# Patient Record
Sex: Male | Born: 1980 | Race: Black or African American | Hispanic: No | Marital: Single | Smoking: Current every day smoker
Health system: Southern US, Community
[De-identification: ages and names within clinical notes are randomized; demographics above are authoritative.]

## PROBLEM LIST (undated history)

## (undated) DIAGNOSIS — I4891 Unspecified atrial fibrillation: Secondary | ICD-10-CM

## (undated) DIAGNOSIS — R55 Syncope and collapse: Secondary | ICD-10-CM

## (undated) DIAGNOSIS — I8390 Asymptomatic varicose veins of unspecified lower extremity: Secondary | ICD-10-CM

## (undated) DIAGNOSIS — R002 Palpitations: Secondary | ICD-10-CM

## (undated) DIAGNOSIS — I10 Essential (primary) hypertension: Secondary | ICD-10-CM

## (undated) HISTORY — DX: Palpitations: R00.2

## (undated) HISTORY — DX: Syncope and collapse: R55

## (undated) HISTORY — PX: APPENDECTOMY: SHX54

---

## 2002-03-25 ENCOUNTER — Emergency Department (HOSPITAL_COMMUNITY): Admission: EM | Admit: 2002-03-25 | Discharge: 2002-03-25 | Payer: Self-pay | Admitting: Emergency Medicine

## 2002-03-25 ENCOUNTER — Encounter: Payer: Self-pay | Admitting: Emergency Medicine

## 2003-09-06 ENCOUNTER — Emergency Department (HOSPITAL_COMMUNITY): Admission: EM | Admit: 2003-09-06 | Discharge: 2003-09-06 | Payer: Self-pay | Admitting: Emergency Medicine

## 2004-04-08 ENCOUNTER — Emergency Department (HOSPITAL_COMMUNITY): Admission: EM | Admit: 2004-04-08 | Discharge: 2004-04-09 | Payer: Self-pay | Admitting: Emergency Medicine

## 2012-05-04 ENCOUNTER — Emergency Department (HOSPITAL_COMMUNITY)
Admission: EM | Admit: 2012-05-04 | Discharge: 2012-05-04 | Disposition: A | Discharge status: Home / Self Care | Payer: Self-pay | Attending: Emergency Medicine | Admitting: Emergency Medicine

## 2012-05-04 ENCOUNTER — Encounter (HOSPITAL_COMMUNITY): Payer: Self-pay | Admitting: *Deleted

## 2012-05-04 ENCOUNTER — Emergency Department (HOSPITAL_COMMUNITY): Payer: Self-pay

## 2012-05-04 DIAGNOSIS — R55 Syncope and collapse: Secondary | ICD-10-CM | POA: Insufficient documentation

## 2012-05-04 DIAGNOSIS — F17200 Nicotine dependence, unspecified, uncomplicated: Secondary | ICD-10-CM | POA: Insufficient documentation

## 2012-05-04 DIAGNOSIS — I10 Essential (primary) hypertension: Secondary | ICD-10-CM | POA: Insufficient documentation

## 2012-05-04 HISTORY — DX: Syncope and collapse: R55

## 2012-05-04 HISTORY — DX: Essential (primary) hypertension: I10

## 2012-05-04 LAB — CBC
HCT: 40.3 % (ref 39.0–52.0)
Hemoglobin: 13.7 g/dL (ref 13.0–17.0)
MCHC: 34 g/dL (ref 30.0–36.0)
MCV: 93.3 fL (ref 78.0–100.0)
Platelets: 184 10*3/uL (ref 150–400)
RBC: 4.32 MIL/uL (ref 4.22–5.81)
RDW: 13 % (ref 11.5–15.5)
WBC: 8.8 10*3/uL (ref 4.0–10.5)

## 2012-05-04 LAB — COMPREHENSIVE METABOLIC PANEL
ALT: 26 U/L (ref 0–53)
AST: 34 U/L (ref 0–37)
Albumin: 4.1 g/dL (ref 3.5–5.2)
Alkaline Phosphatase: 50 U/L (ref 39–117)
BUN: 9 mg/dL (ref 6–23)
Chloride: 102 mEq/L (ref 96–112)
Creatinine, Ser: 1.05 mg/dL (ref 0.50–1.35)
GFR calc Af Amer: >90 mL/min (ref >90)
GFR calc non Af Amer: >90 mL/min (ref >90)
Glucose, Bld: 94 mg/dL (ref 70–99)
Potassium: 3.5 mEq/L (ref 3.5–5.1)
Sodium: 138 mEq/L (ref 135–145)
Total Bilirubin: 0.4 mg/dL (ref 0.3–1.2)
Total Protein: 7.9 g/dL (ref 6.0–8.3)

## 2012-05-04 LAB — PROTIME-INR
INR: 0.97 (ref 0.00–1.49)
Prothrombin Time: 13.1 seconds (ref 11.6–15.2)

## 2012-05-04 MED ORDER — SODIUM CHLORIDE 0.9 % IV BOLUS (SEPSIS)
500.0000 mL | Freq: Once | INTRAVENOUS | Status: AC
  Administered 2012-05-04: 500 mL via INTRAVENOUS

## 2012-05-04 MED ORDER — ONDANSETRON HCL 4 MG/2ML IJ SOLN
4.0000 mg | Freq: Once | INTRAMUSCULAR | Status: AC
  Administered 2012-05-04: 4 mg via INTRAVENOUS

## 2012-05-04 MED ORDER — ONDANSETRON HCL 4 MG/2ML IJ SOLN
INTRAMUSCULAR | Status: AC
  Filled 2012-05-04: qty 2

## 2012-05-04 NOTE — ED Notes (Signed)
Pt ambulated without assistance.  EDP notified

## 2012-05-04 NOTE — ED Notes (Signed)
While visiting another pt, RN noticed pt was pale and diaphoretic, pt c/o SOB and dizziness. HR low 40s, placed on stretcher and put in [place].

## 2012-05-04 NOTE — ED Provider Notes (Addendum)
History     CSN: [id number]  Arrival date & time 05/04/12  0215   First MD Initiated Contact with Patient 05/04/12 0215      Chief Complaint  Patient presents with  . Loss of Consciousness    (Consider location/radiation/quality/duration/timing/severity/associated sxs/prior treatment) Patient is a 31 y.o. male presenting with syncope. The history is provided by the patient.  Loss of Consciousness This is a new problem. The current episode started less than 1 hour ago. The problem occurs constantly. The problem has been gradually improving. Associated symptoms include shortness of breath. Pertinent negatives include no chest pain and no abdominal pain. Nothing aggravates the symptoms. Nothing relieves the symptoms.    Past Medical History  Diagnosis Date  . Hypertension     History reviewed. No pertinent past surgical history.  History reviewed. No pertinent family history.  History  Substance Use Topics  . Smoking status: Current Everyday Smoker -- 1.0 packs/day  . Smokeless tobacco: Not on file  . Alcohol Use: Yes      Review of Systems  Respiratory: Positive for shortness of breath.   Cardiovascular: Positive for syncope. Negative for chest pain.  Gastrointestinal: Negative for abdominal pain.  All other systems reviewed and are negative.    Allergies  Penicillins  Home Medications  No current outpatient prescriptions on file.  BP 114/60  Pulse 66  Resp 18  SpO2 91%  Physical Exam  Constitutional: He is oriented to person, place, and time. He appears well-developed and well-nourished.  HENT:  Head: Normocephalic and atraumatic.  Eyes: Conjunctivae are normal. Pupils are equal, round, and reactive to light.  Neck: Normal range of motion. Neck supple.  Cardiovascular: Normal rate, regular rhythm, normal heart sounds and intact distal pulses.   Pulmonary/Chest: Effort normal and breath sounds normal.  Abdominal: Soft. Bowel sounds are normal.    Neurological: He is alert and oriented to person, place, and time.  Skin: Skin is warm. He is diaphoretic.  Psychiatric: He has a normal mood and affect. His behavior is normal. Judgment and thought content normal.    ED Course  Procedures (including critical care time)   Labs Reviewed  GLUCOSE, CAPILLARY  CBC  COMPREHENSIVE METABOLIC PANEL  PROTIME-INR  DRUG SCREEN PANEL (SERUM)   No results found.   No diagnosis found.   Date: 05/04/2012  Rate: 65  Rhythm: normal sinus rhythm  QRS Axis: normal  Intervals: normal  ST/T Wave abnormalities: nonspecific ST changes  Conduction Disutrbances:nonspecific intraventricular conduction delay  Narrative Interpretation:   Old EKG Reviewed: none available    MDM  + syncopal episode while visiting pt.  Pt had seen person visiting pass large amount of blood vaginally, and then became lightheaded and dizzy.Marland Kitchen  Sxs gradually improving,  Will lab, ct,  reasess   Improved.  At baseline.  Labs/ct benign.  Will dc to [facility] outpt     Tasnim Balentine Lytle Michaels, MD 05/04/12 [id number]  Rosanne Ashing, MD 05/04/12 [phone]

## 2012-05-04 NOTE — ED Notes (Signed)
No rx given, pt voiced understanding to f/u with PCP and return for worsening condition

## 2012-05-14 LAB — DRUG SCREEN PANEL (SERUM)

## 2012-05-28 ENCOUNTER — Emergency Department (HOSPITAL_COMMUNITY)
Admission: EM | Admit: 2012-05-28 | Discharge: 2012-05-28 | Disposition: A | Discharge status: Home Health | Payer: Self-pay | Attending: Emergency Medicine | Admitting: Emergency Medicine

## 2012-05-28 ENCOUNTER — Encounter (HOSPITAL_COMMUNITY): Payer: Self-pay | Admitting: Family Medicine

## 2012-05-28 DIAGNOSIS — F17200 Nicotine dependence, unspecified, uncomplicated: Secondary | ICD-10-CM | POA: Insufficient documentation

## 2012-05-28 DIAGNOSIS — R002 Palpitations: Secondary | ICD-10-CM | POA: Insufficient documentation

## 2012-05-28 DIAGNOSIS — I4891 Unspecified atrial fibrillation: Secondary | ICD-10-CM | POA: Insufficient documentation

## 2012-05-28 DIAGNOSIS — I10 Essential (primary) hypertension: Secondary | ICD-10-CM | POA: Insufficient documentation

## 2012-05-28 HISTORY — DX: Unspecified atrial fibrillation: I48.91

## 2012-05-28 LAB — POCT I-STAT TROPONIN I: Troponin i, poc: 0 ng/mL (ref 0.00–0.08)

## 2012-05-28 LAB — CBC WITH DIFFERENTIAL/PLATELET
Basophils Absolute: 0 10*3/uL (ref 0.0–0.1)
Basophils Relative: 0 % (ref 0–1)
Eosinophils Absolute: 0.1 10*3/uL (ref 0.0–0.7)
Eosinophils Relative: 2 % (ref 0–5)
HCT: 45.4 % (ref 39.0–52.0)
Hemoglobin: 15 g/dL (ref 13.0–17.0)
Lymphocytes Relative: 47 % — ABNORMAL HIGH (ref 12–46)
Lymphs Abs: 2.5 10*3/uL (ref 0.7–4.0)
MCH: 31.2 pg (ref 26.0–34.0)
MCHC: 33 g/dL (ref 30.0–36.0)
MCV: 94.4 fL (ref 78.0–100.0)
Monocytes Absolute: 0.5 10*3/uL (ref 0.1–1.0)
Monocytes Relative: 8 % (ref 3–12)
Neutro Abs: 2.3 10*3/uL (ref 1.7–7.7)
Neutrophils Relative %: 43 % (ref 43–77)
Platelets: 227 10*3/uL (ref 150–400)
RBC: 4.81 MIL/uL (ref 4.22–5.81)
RDW: 13.1 % (ref 11.5–15.5)
WBC: 5.4 10*3/uL (ref 4.0–10.5)

## 2012-05-28 LAB — RAPID URINE DRUG SCREEN, HOSP PERFORMED
Amphetamines: NOT DETECTED
Barbiturates: NOT DETECTED
Benzodiazepines: NOT DETECTED
Cocaine: NOT DETECTED
Opiates: NOT DETECTED
Tetrahydrocannabinol: POSITIVE — AB

## 2012-05-28 LAB — POCT I-STAT, CHEM 8
BUN: 14 mg/dL (ref 6–23)
Creatinine, Ser: 1.2 mg/dL (ref 0.50–1.35)
Hemoglobin: 17 g/dL (ref 13.0–17.0)
Potassium: 4.1 mEq/L (ref 3.5–5.1)
Sodium: 139 mEq/L (ref 135–145)

## 2012-05-28 NOTE — Progress Notes (Signed)
10:20 AM  Date: 05/28/2012  Rate: 90  Rhythm: normal sinus rhythm and sinus arrhythmia  QRS Axis: normal  Intervals: normal QRS:  Q waves in inferior leads suggest possible old inferior myocardial infarction.  ST/T Wave abnormalities: normal  Conduction Disutrbances:none  Narrative Interpretation: Abnormal EKG  Old EKG Reviewed: unchanged

## 2012-05-28 NOTE — ED Notes (Signed)
Patient states last night his felt like it was fluttering and he could not take deep breaths.  Patient states he has history of a-fib.  Patient asymptomatic at this time, patient with no c/o and denies chest pain

## 2012-05-28 NOTE — ED Notes (Signed)
Pt complaining of his heart fluttering since last night. sts hx of A fib. sts associated with nausea, SOB, dizzy and lightheaded. Denies chest pain.

## 2012-05-28 NOTE — ED Provider Notes (Deleted)
10:07 AM  Date: 05/28/2012  Rate:90  Rhythm: normal sinus rhythm and sinus arrhythmia  QRS Axis: normal  Intervals: normal QRS:  Q waves in inferior leads suggest possible old inferior myocardial infarction.  ST/T Wave abnormalities: normal  Conduction Disutrbances:none  Narrative Interpretation: Abnormal EKG  Old EKG Reviewed: none available    Carleene Cooper III, MD 05/28/12 1009

## 2012-05-28 NOTE — ED Provider Notes (Signed)
History     CSN: [id number]  Arrival date & time 05/28/12  4098   First MD Initiated Contact with Patient 05/28/12 1014      Chief Complaint  Patient presents with  . Atrial Fibrillation    (Consider location/radiation/quality/duration/timing/severity/associated sxs/prior treatment) Patient is a 31 y.o. male presenting with palpitations. The history is provided by the patient.  Palpitations  This is a new problem. The current episode started yesterday. The problem has been resolved. The problem is associated with an unknown factor. Associated symptoms include diaphoresis, nausea, headaches, dizziness, weakness and shortness of breath. Pertinent negatives include no fever, no numbness, no abdominal pain, no vomiting and no cough.  Pt states last night and this morning intermittent palpitations with dizziness, nausea, SOB. States has hx of afib, not taking his medications for it. States no chest pain or abdominal pain. Admits to drinking alcohol sat, otherwise no drugs. States at time of symptoms has some tingling down left arm. No hx of CAD. At present, he is asymptomatic.   Past Medical History  Diagnosis Date  . Hypertension   . Atrial fibrillation     History reviewed. No pertinent past surgical history.  History reviewed. No pertinent family history.  History  Substance Use Topics  . Smoking status: Current Everyday Smoker -- 1.0 packs/day  . Smokeless tobacco: Not on file  . Alcohol Use: Yes      Review of Systems  Constitutional: Positive for diaphoresis. Negative for fever and chills.  HENT: Negative for neck pain.   Eyes: Negative for visual disturbance.  Respiratory: Positive for shortness of breath. Negative for cough, chest tightness and wheezing.   Cardiovascular: Positive for palpitations.  Gastrointestinal: Positive for nausea. Negative for vomiting, abdominal pain and diarrhea.  Musculoskeletal: Negative.   Skin: Negative.   Neurological: Positive for  dizziness, weakness, light-headedness and headaches. Negative for numbness.  Hematological: Negative.     Allergies  Penicillins  Home Medications  No current outpatient prescriptions on file.  BP 139/92  Temp 97.6 F (36.4 C) (Oral)  Resp 18  SpO2 100%  Physical Exam  Nursing note and vitals reviewed. Constitutional: He is oriented to person, place, and time. He appears well-developed and well-nourished. No distress.  Neck: Neck supple.  Cardiovascular: Normal rate, regular rhythm and normal heart sounds.   Pulmonary/Chest: Effort normal and breath sounds normal. No respiratory distress. He has no wheezes. He has no rales.  Abdominal: Soft. Bowel sounds are normal. He exhibits no distension. There is no tenderness. There is no rebound.  Musculoskeletal: He exhibits no edema.  Neurological: He is alert and oriented to person, place, and time.  Skin: Skin is warm and dry.  Psychiatric: He has a normal mood and affect.    ED Course  Procedures (including critical care time)  Pt currently asymptomatic, ECG showing NSR.  Filed Vitals:   05/28/12 1038  BP: 139/92  Temp: 97.6 F (36.4 C)  Resp: 18   Will get labs.   Results for orders placed during the hospital encounter of 05/28/12  CBC WITH DIFFERENTIAL      Component Value Range   WBC 5.4  4.0 - 10.5 K/uL   RBC 4.81  4.22 - 5.81 MIL/uL   Hemoglobin 15.0  13.0 - 17.0 g/dL   HCT 11.9  14.7 - 82.9 %   MCV 94.4  78.0 - 100.0 fL   MCH 31.2  26.0 - 34.0 pg   MCHC 33.0  30.0 - 36.0 g/dL  RDW 13.1  11.5 - 15.5 %   Platelets 227  150 - 400 K/uL   Neutrophils Relative 43  43 - 77 %   Neutro Abs 2.3  1.7 - 7.7 K/uL   Lymphocytes Relative 47 (*) 12 - 46 %   Lymphs Abs 2.5  0.7 - 4.0 K/uL   Monocytes Relative 8  3 - 12 %   Monocytes Absolute 0.5  0.1 - 1.0 K/uL   Eosinophils Relative 2  0 - 5 %   Eosinophils Absolute 0.1  0.0 - 0.7 K/uL   Basophils Relative 0  0 - 1 %   Basophils Absolute 0.0  0.0 - 0.1 K/uL  URINE  RAPID DRUG SCREEN (HOSP PERFORMED)      Component Value Range   Opiates NONE DETECTED  NONE DETECTED   Cocaine NONE DETECTED  NONE DETECTED   Benzodiazepines NONE DETECTED  NONE DETECTED   Amphetamines NONE DETECTED  NONE DETECTED   Tetrahydrocannabinol POSITIVE (*) NONE DETECTED   Barbiturates NONE DETECTED  NONE DETECTED  POCT I-STAT, CHEM 8      Component Value Range   Sodium 139  135 - 145 mEq/L   Potassium 4.1  3.5 - 5.1 mEq/L   Chloride 101  96 - 112 mEq/L   BUN 14  6 - 23 mg/dL   Creatinine, Ser 7.82  0.50 - 1.35 mg/dL   Glucose, Bld 87  70 - 99 mg/dL   Calcium, Ion 9.56 (*) 1.12 - 1.23 mmol/L   TCO2 27  0 - 100 mmol/L   Hemoglobin 17.0  13.0 - 17.0 g/dL   HCT 21.3  08.6 - 57.8 %  POCT I-STAT TROPONIN I      Component Value Range   Troponin i, poc 0.00  0.00 - 0.08 ng/mL   Comment 3             Workup unremarkable. Normal sinus on monitor here, and pt is symptom free. I spoke with [facility] cardiology, will call him tomorrow for outpatient follow up for possible holter and further testing. Pt agrees with the plan. Ceasar Mons Vitals:   05/28/12 1243  BP: 115/98  Pulse: 80  Temp:   Resp: 16     1. Palpitations       MDM          Lottie Mussel, PA 05/28/12 1611

## 2012-05-29 NOTE — ED Provider Notes (Signed)
Medical screening examination/treatment/procedure(s) were performed by non-physician practitioner and as supervising physician I was immediately available for consultation/collaboration.   Carleene Cooper III, MD 05/29/12 1054

## 2012-06-22 ENCOUNTER — Encounter: Payer: Self-pay | Admitting: Cardiology

## 2012-06-25 ENCOUNTER — Ambulatory Visit: Payer: Self-pay | Admitting: Cardiology

## 2012-08-03 ENCOUNTER — Encounter: Payer: Self-pay | Admitting: *Deleted

## 2012-08-08 ENCOUNTER — Ambulatory Visit: Payer: Self-pay | Admitting: Cardiology

## 2012-08-30 ENCOUNTER — Encounter: Payer: Self-pay | Admitting: *Deleted

## 2012-09-03 ENCOUNTER — Encounter: Payer: Self-pay | Admitting: Cardiology

## 2012-09-03 ENCOUNTER — Ambulatory Visit (INDEPENDENT_AMBULATORY_CARE_PROVIDER_SITE_OTHER): Payer: Self-pay | Admitting: Cardiology

## 2012-09-03 VITALS — BP 124/86 | HR 67 | Ht 70.0 in | Wt 263.0 lb

## 2012-09-03 DIAGNOSIS — I4891 Unspecified atrial fibrillation: Secondary | ICD-10-CM

## 2012-09-03 DIAGNOSIS — R0989 Other specified symptoms and signs involving the circulatory and respiratory systems: Secondary | ICD-10-CM

## 2012-09-03 DIAGNOSIS — R55 Syncope and collapse: Secondary | ICD-10-CM

## 2012-09-03 DIAGNOSIS — R0609 Other forms of dyspnea: Secondary | ICD-10-CM

## 2012-09-03 DIAGNOSIS — G4733 Obstructive sleep apnea (adult) (pediatric): Secondary | ICD-10-CM

## 2012-09-03 DIAGNOSIS — R0683 Snoring: Secondary | ICD-10-CM

## 2012-09-03 LAB — TSH: TSH: 1.08 u[IU]/mL (ref 0.35–5.50)

## 2012-09-03 MED ORDER — DILTIAZEM HCL ER COATED BEADS 120 MG PO CP24: 120.0000 mg | ORAL_CAPSULE | Freq: Every day | ORAL | Status: DC

## 2012-09-03 NOTE — Progress Notes (Signed)
Patient ID: Vincent Mcpherson, male   DOB: [birth year], 31 y.o.   MRN: [id number] 31 yo with history of atrial fibrillation presents for cardiology evaluation.  He was admitted in 2012 at [facility] with palpitations.  He was told that he was in atrial fibrillation.  They kept him a couple of days.  He was told he had sleep apnea.  He was discharged on a medication but does not remember what it was and is no longer taking it.  He passed out in 8/13.  He was seen in the ER, and according to the ER report, it was thought to be vasovagal (stressful event when the syncope occurred).  However, patient reports racing heart rate just before passing out.  He was admitted again in 9/13 after drinking a lot of ETOH and developing tachypalpitations.  Per ER report, he was in atrial fibrillation with RVR.  However, the only ECG available from the ER showed NSR.    Since that event, he has been stable.  He has palpitations off and on but nothing severe.  Short episodes occur where he feels his heart race.  He does not take any medications.  At some point, he was diagnosed with HTN but BP is ok today and he is not on medications.  No exertional dyspnea or chest pain.  No further syncope or lightheadedness.  He does snore loudly and has daytime sleepiness.    ECG: NSR, LVH  Labs (9/13): K 4.1, creatinine 1.2  PMH: 1. Atrial fibrillation: Summer 2012 admitted at [facility] for a couple of days with atrial fibrillation.  Does not remember a lot of the details of this hospitalization.  To ER at [facility] in 9/13 with palpitations, reportedly in atrial fibrillation on arrival to ER but only ECG in system shows NSR.   2. ?HTN: At some point was given a diagnosis of HTN but says he has never been on medications for it.  3. OSA: Not on CPAP. Was told he had OSA when he was admitted at [facility] in 2012.  4. H/o syncope: 8/13.  Was seen in ER.  It sounds like it was thought to be vasovagal based on ER note but patients reported  tachypalpitations prior to passing out.   SH: Smokes 1 ppd, smokes marijuana, no cocaine.  Drinks ETOH, occasionally heavy.  Unemployed.   FH: Mother with atrial fibrillation.   ROS: All systems reviewed and negative except as per HPI.   Current Outpatient Prescriptions  Medication Sig Dispense Refill  . naproxen sodium (ALEVE) 220 MG tablet PRN      . aspirin EC 81 MG tablet Take 1 tablet (81 mg total) by mouth daily.      Marland Kitchen diltiazem (CARDIZEM CD) 120 MG 24 hr capsule Take 1 capsule (120 mg total) by mouth daily.  30 capsule  3    BP 124/86  Pulse 67  Ht 5\' 10"  (1.778 m)  Wt 263 lb (119.296 kg)  BMI 37.74 kg/m2  SpO2 97% General: NAD, obese.  Neck: No JVD, no thyromegaly or thyroid nodule.  Lungs: Clear to auscultation bilaterally with normal respiratory effort. CV: Nondisplaced PMI.  Heart regular S1/S2, no S3/S4, no murmur.  No peripheral edema.  No carotid bruit.  Normal pedal pulses.  Abdomen: Soft, nontender, no hepatosplenomegaly, no distention.  Skin: Intact without lesions or rashes.  Neurologic: Alert and oriented x 3.  Psych: Normal affect. Extremities: No clubbing or cyanosis.  HEENT: Normal.   Assessment/Plan: 1. Atrial fibrillation: Apparently  has paroxysmal atrial fibrillation.  He had several days of hospitalization at [facility] apparently for atrial fibrillation in 2012.  He presented to [facility] ER in 9/13 with atrial fibrillation/RVR after drinking ETOH.  However, the only ECG from this visit shows NSR.  He continues to have episodes of tachypalpitations on occasion.   - Start diltiazem CD 120 mg daily.  - CHADSVASC score = 0 or 1 (? HTN) so will take ASA 81.   - 30 day event monitor to document atrial fibrillation and its frequency.   - Avoid heavy ETOH intake.   - Echocardiogram.  - Check TSH 2. OSA: He was told he had sleep apnea at [facility] in 2010.  He has daytime sleepiness and loud snoring. He has not been formally diagnosed by sleep study.  Will  arrange sleep study.  3. Syncope: Syncopal episode in 8/13.  Felt heart racing prior to passing out.  Cannot rule out arrhythmic event (however, was exposed to stressful event at the time and felt to be vasovagal).  As above, getting 30 day event monitor.   Marca Ancona 09/03/2012 4:58 PM

## 2012-09-03 NOTE — Patient Instructions (Addendum)
Take aspirin 81mg  daily. Take this with food.   Take Diltiazem CD 120mg  daily.  Your physician recommends that you have lab work today--TSH  Your physician has requested that you have an echocardiogram. Echocardiography is a painless test that uses sound waves to create images of your heart. It provides your doctor with information about the size and shape of your heart and how well your heart's chambers and valves are working. This procedure takes approximately one hour. There are no restrictions for this procedure.  Your physician has recommended that you have a sleep study. This test records several body functions during sleep, including: brain activity, eye movement, oxygen and carbon dioxide blood levels, heart rate and rhythm, breathing rate and rhythm, the flow of air through your mouth and nose, snoring, body muscle movements, and chest and belly movement.   Your physician has recommended that you wear an event monitor. Event monitors are medical devices that record the heart's electrical activity. Doctors most often [place] these monitors to diagnose arrhythmias. Arrhythmias are problems with the speed or rhythm of the heartbeat. The monitor is a small, portable device. You can wear one while you do your normal daily activities. This is usually used to diagnose what is causing palpitations/syncope (passing out). 30 day event monitor  Your physician recommends that you schedule a follow-up appointment in: about 6 weeks with Dr Shirlee Latch after all testing has been completed.

## 2012-09-18 ENCOUNTER — Telehealth: Payer: Self-pay | Admitting: *Deleted

## 2012-09-18 NOTE — Telephone Encounter (Signed)
MONITOR      [name]     Sent: Mon September 10, 2012  8:29 AM         Vincent Mcpherson    MRN: [id number] DOB: [birth year]    Pt Home: [phone]               Message     09/10/12   09/03/12 PATIENT WILL FILL OUT LIFE WATCH FORM AND RETURN IT/D.MILLER----09/05/12 PATIENT WILL SEE ME ON FRIDAY/D.MILLER----09/10/12  PATIENT NO SHOW./D.MILLER

## 2012-09-21 ENCOUNTER — Ambulatory Visit (HOSPITAL_COMMUNITY): Payer: Self-pay | Attending: Cardiology | Admitting: Radiology

## 2012-09-21 DIAGNOSIS — I4891 Unspecified atrial fibrillation: Secondary | ICD-10-CM | POA: Insufficient documentation

## 2012-09-21 DIAGNOSIS — I10 Essential (primary) hypertension: Secondary | ICD-10-CM | POA: Insufficient documentation

## 2012-09-21 DIAGNOSIS — I059 Rheumatic mitral valve disease, unspecified: Secondary | ICD-10-CM | POA: Insufficient documentation

## 2012-09-21 DIAGNOSIS — F17200 Nicotine dependence, unspecified, uncomplicated: Secondary | ICD-10-CM | POA: Insufficient documentation

## 2012-09-21 DIAGNOSIS — I369 Nonrheumatic tricuspid valve disorder, unspecified: Secondary | ICD-10-CM | POA: Insufficient documentation

## 2012-09-21 NOTE — Progress Notes (Signed)
Echocardiogram performed.  

## 2012-10-04 ENCOUNTER — Ambulatory Visit (HOSPITAL_BASED_OUTPATIENT_CLINIC_OR_DEPARTMENT_OTHER): Payer: Self-pay

## 2012-10-15 ENCOUNTER — Ambulatory Visit: Payer: Self-pay | Admitting: Cardiology

## 2012-11-07 ENCOUNTER — Telehealth: Payer: Self-pay | Admitting: *Deleted

## 2012-11-07 NOTE — Telephone Encounter (Signed)
Rec'd pc from [facility] [place]. They are having difficulty getting in touch with patient. Requesting our assistance to contact him regarding his Alert System.   PC to patient. LMTCB using his mobile number, as the home number is no longer valid, per automated reply.

## 2012-11-08 NOTE — Telephone Encounter (Signed)
Follow-up:    Called in today wanting to follow-up on her previous call.  Please call back.

## 2012-11-08 NOTE — Telephone Encounter (Signed)
Returned call to [employer]. They have been unable to contact pt at either of his phone numbers to send him a life watch monitor to wear. They have been trying to reach him since around 10/08/12.  Will forward this to Marja Kays RN

## 2012-11-12 NOTE — Telephone Encounter (Signed)
I spoke with pt today at [phone]. He told me this was a good phone number to contact him. I called [employer] [phone]

## 2012-11-12 NOTE — Telephone Encounter (Signed)
I spoke with Jonny Ruiz at [employer]. He is aware pt can be reached at [phone].

## 2013-02-12 ENCOUNTER — Telehealth: Payer: Self-pay | Admitting: *Deleted

## 2013-02-12 NOTE — Telephone Encounter (Signed)
Upon further review, Pt was approved [employer] application for an event monitor through [employer] in January 2014. Per [employer], unsuccessful to reach Pt-(declined by Pt?) TK

## 2013-11-16 ENCOUNTER — Encounter (HOSPITAL_COMMUNITY): Payer: Self-pay | Admitting: Emergency Medicine

## 2013-11-16 ENCOUNTER — Emergency Department (HOSPITAL_COMMUNITY): Payer: Self-pay

## 2013-11-16 ENCOUNTER — Emergency Department (HOSPITAL_COMMUNITY)
Admission: EM | Admit: 2013-11-16 | Discharge: 2013-11-16 | Disposition: A | Discharge status: Home / Self Care | Payer: Self-pay | Attending: Emergency Medicine | Admitting: Emergency Medicine

## 2013-11-16 DIAGNOSIS — F17200 Nicotine dependence, unspecified, uncomplicated: Secondary | ICD-10-CM | POA: Insufficient documentation

## 2013-11-16 DIAGNOSIS — Y9301 Activity, walking, marching and hiking: Secondary | ICD-10-CM | POA: Insufficient documentation

## 2013-11-16 DIAGNOSIS — M25579 Pain in unspecified ankle and joints of unspecified foot: Secondary | ICD-10-CM | POA: Insufficient documentation

## 2013-11-16 DIAGNOSIS — Z880 Allergy status to penicillin: Secondary | ICD-10-CM | POA: Insufficient documentation

## 2013-11-16 DIAGNOSIS — Y929 Unspecified place or not applicable: Secondary | ICD-10-CM | POA: Insufficient documentation

## 2013-11-16 DIAGNOSIS — R002 Palpitations: Secondary | ICD-10-CM | POA: Insufficient documentation

## 2013-11-16 DIAGNOSIS — Z79899 Other long term (current) drug therapy: Secondary | ICD-10-CM | POA: Insufficient documentation

## 2013-11-16 DIAGNOSIS — M25571 Pain in right ankle and joints of right foot: Secondary | ICD-10-CM

## 2013-11-16 DIAGNOSIS — W010XXA Fall on same level from slipping, tripping and stumbling without subsequent striking against object, initial encounter: Secondary | ICD-10-CM | POA: Insufficient documentation

## 2013-11-16 DIAGNOSIS — I4891 Unspecified atrial fibrillation: Secondary | ICD-10-CM | POA: Insufficient documentation

## 2013-11-16 DIAGNOSIS — M25572 Pain in left ankle and joints of left foot: Secondary | ICD-10-CM

## 2013-11-16 MED ORDER — HYDROCODONE-ACETAMINOPHEN 5-325 MG PO TABS: 1.0000 | ORAL_TABLET | ORAL | Status: DC | PRN

## 2013-11-16 MED ORDER — HYDROCODONE-ACETAMINOPHEN 5-325 MG PO TABS
1.0000 | ORAL_TABLET | Freq: Once | ORAL | Status: AC
  Administered 2013-11-16: 1 via ORAL
  Filled 2013-11-16: qty 1

## 2013-11-16 MED ORDER — IBUPROFEN 800 MG PO TABS: 800.0000 mg | ORAL_TABLET | Freq: Three times a day (TID) | ORAL | Status: DC | PRN

## 2013-11-16 NOTE — Discharge Instructions (Signed)
Read the information below.  Use the prescribed medication as directed.  Please discuss all new medications with your pharmacist.  Do not take additional tylenol while taking the prescribed pain medication to avoid overdose.  You may return to the Emergency Department at any time for worsening condition or any new symptoms that concern you.  If you develop uncontrolled pain, weakness or numbness of the extremity, severe discoloration of the skin, or you are unable to walk or move your fingers, return to the ER for a recheck.      Musculoskeletal Pain Musculoskeletal pain is muscle and boney aches and pains. These pains can occur in any part of the body. Your caregiver may treat you without knowing the cause of the pain. They may treat you if blood or urine tests, X-rays, and other tests were normal.  CAUSES There is often not a definite cause or reason for these pains. These pains may be caused by a type of germ (virus). The discomfort may also come from overuse. Overuse includes working out too hard when your body is not fit. Boney aches also come from weather changes. Bone is sensitive to atmospheric pressure changes. HOME CARE INSTRUCTIONS   Ask when your test results will be ready. Make sure you get your test results.  Only take over-the-counter or prescription medicines for pain, discomfort, or fever as directed by your caregiver. If you were given medications for your condition, do not drive, operate machinery or power tools, or sign legal documents for 24 hours. Do not drink alcohol. Do not take sleeping pills or other medications that may interfere with treatment.  Continue all activities unless the activities cause more pain. When the pain lessens, slowly resume normal activities. Gradually increase the intensity and duration of the activities or exercise.  During periods of severe pain, bed rest may be helpful. Lay or sit in any position that is comfortable.  Putting ice on the injured  area.  Put ice in a bag.  Place a towel between your skin and the bag.  Leave the ice on for 15 to 20 minutes, 3 to 4 times a day.  Follow up with your caregiver for continued problems and no reason can be found for the pain. If the pain becomes worse or does not go away, it may be necessary to repeat tests or do additional testing. Your caregiver may need to look further for a possible cause. SEEK IMMEDIATE MEDICAL CARE IF:  You have pain that is getting worse and is not relieved by medications.  You develop chest pain that is associated with shortness or breath, sweating, feeling sick to your stomach (nauseous), or throw up (vomit).  Your pain becomes localized to the abdomen.  You develop any new symptoms that seem different or that concern you. MAKE SURE YOU:   Understand these instructions.  Will watch your condition.  Will get help right away if you are not doing well or get worse. Document Released: 09/05/2005 Document Revised: 11/28/2011 Document Reviewed: 05/10/2013 [facility] Patient Information 2014 [place], [place].    Emergency Department Resource Guide 1) Find a Doctor and Pay Out of Pocket Although you won't have to find out who is covered by your insurance plan, it is a good idea to ask around and get recommendations. You will then need to call the office and see if the doctor you have chosen will accept you as a new patient and what types of options they offer for patients who are self-pay. Some  doctors offer discounts or will set up payment plans for their patients who do not have insurance, but you will need to ask so you aren't surprised when you get to your appointment.  2) Contact Your Local Health Department Not all health departments have doctors that can see patients for sick visits, but many do, so it is worth a call to see if yours does. If you don't know where your local health department is, you can check in your phone book. The CDC also has a tool to help  you locate your state's health department, and many state websites also have listings of all of their local health departments.  3) Find a Walk-in Clinic If your illness is not likely to be very severe or complicated, you may want to try a walk in clinic. These are popping up all over the country in pharmacies, drugstores, and shopping centers. They're usually staffed by nurse practitioners or physician assistants that have been trained to treat common illnesses and complaints. They're usually fairly quick and inexpensive. However, if you have serious medical issues or chronic medical problems, these are probably not your best option.  No Primary Care Doctor: - Call Health Connect at  [phone] - they can help you locate a primary care doctor that  accepts your insurance, provides certain services, etc. - Physician Referral Service- [phone]  Chronic Pain Problems: Organization         Address  Phone   Notes  [facility]  [phone] Patients need to be referred by their primary care doctor.   Medication Assistance: Organization         Address  Phone   Notes  [facility] Medication [facility] [address] [place], [place] [address] [phone] --Must be a resident of [facility] -- Must have NO insurance coverage whatsoever (no Medicaid/ Medicare, etc.) -- The pt. MUST have a primary care doctor that directs their care regularly and follows them in the community   [facility]ist  [phone]   [employer]  [phone]    Agencies that provide inexpensive medical care: Organization         Address  Phone   Notes  [facility]  [phone]   [facility]    [phone]   [facility] [address] [place], [place] [address] [phone]   [facility] [address] [place] [place] [phone]   Planned Parenthood    [phone]   [facility]    [phone]   [facility]  [address], [place] Phone:  [phone], Fax:  [phone] Hours of Operation:  9 am - 6 pm, M-F.  Also accepts Medicaid/Medicare and self-pay.  [facility]  [address], [place] Phone: [phone], Fax: [phone]. Hours of Operation:  8:30 am - 5:30 pm, M-F.  Also accepts Medicaid and self-pay.  [facility] [place] [address] [place] Phone: [phone]   [facility] [address] Natasha Bence [place], [place] [phone], Ext. [phone] Mondays & Thursdays: 7-9 AM.  First 15 patients are seen on a first come, first serve basis.    Medicaid-accepting [facility] Providers:  Organization         Address  Phone   Notes  [facility] [address], [place] [phone] Also accepts self-pay patients.  [facility] [address] Laurell Josephs [place], [place]  [phone]   [facility] [address], [place] [phone]   [facility] [address], [place] [phone]   Renaye Rakers [address], [place]   [phone] Only accepts [place] patients after they have their name applied to their card.   Self-Pay (no insurance) in [facility]:  Organization         Address  Phone   Notes  Sickle Cell Patients, [facility] [address] [place], [place] [phone]   [facility] [address] [place], [place] [phone]   [facility] [place]  [address], [place] [phone]   [facility]/Dr. Osei-Bonsu  [address], [place] or [address], [place] [phone] Phone number for both [place] and [place] locations is the same.  [facility] [address],  [place] [phone]   [facility] [address] [place] or [address] [phone]   [facility] [address] [place] [phone], phone; [phone], fax Sees patients 1st and 3rd Saturday of every month.  Must not qualify for public or private insurance (i.e. Medicaid, Medicare, [place] Health Choice, Veterans' Benefits)  Household income should be no more than 200% of the poverty level The clinic cannot treat you if you are pregnant or think you are pregnant  Sexually transmitted diseases are not treated at the clinic.    Dental Care: Organization         Address  Phone  Notes  [facility] Department of [facility] [address] [place], [place] [phone] Accepts children up to age 39 who are enrolled in [place] or [place] Health Choice; pregnant women with a Medicaid card; and children who have applied for Medicaid or [place] Health Choice, but were declined, whose parents can pay a reduced fee at time of service.  [facility] of [facility]  [address], [place] [phone] Accepts children up to age 77 who are enrolled in [place] or [place] Health Choice; pregnant women with a Medicaid card; and children who have applied for Medicaid or  Health Choice, but were declined, whose parents can pay a reduced fee at time of service.  [facility]  [address] [place], [place] [phone] Patients are seen by appointment only. Walk-ins are not accepted. [facility] will see patients 58 years of age and older. Monday - Tuesday (8am-5pm) Most Wednesdays (8:30-5pm) $30 per visit, cash only  [facility]  [address], [facility] [phone] Patients are seen by appointment only. Walk-ins are not accepted. [facility] will see patients 62 years of age and older. One Wednesday Evening  (Monthly: Volunteer Based).  $30 per visit, cash only  [employer]  [phone] for adults; Children under age 59, call Graduate Pediatric Dentistry at [phone]. Children aged 47-14, please call [phone] to request a pediatric application.  Dental services are provided in all areas of dental care including fillings, crowns and bridges, complete and partial dentures, implants, gum treatment, root canals, and extractions. Preventive  care is also provided. Treatment is provided to both adults and children. Patients are selected via a lottery and there is often a waiting list.   [facility] [address], [place]  [phone] [email or web address]   [facility] [address], [place], [place] [phone], Ext. [phone] Second and Fourth Thursday of each month, opens at 6:30 AM; Clinic ends at 9 AM.  Patients are seen on a first-come first-served basis, and a limited number are seen during each clinic.   [facility]  [address], [place], [place] [phone]   Eligibility Requirements You must have lived in [place], [place], or [place] counties for at least the last three months.   You cannot be eligible for state or federal sponsored [place]healthcare insurance, including CIG[employer]eterans Administration, [place]icaid, or [employer]icare.   You generally cannot be eligible for healthcare insurance through your employer.    How to apply: Eligibility screenings are held every Tuesday and Wednesday afternoon from 1:00 pm until 4:00 pm. You do not need an appointment for the interview!  [facility] [address], [place], [place] [phone]   [facility]  [phone]   [facility]  [phone]   [facility]  [phone]    Behavioral Health Resources in the Community: Intensive Outpatient Programs Organization         Address  Phone  Notes  [facility] [address], [place], [place] [phone]   [facility] [address], [place], [place] [phone]   [facility] [address], [place], [place]  [phone]   [facility] [address],  [place], [place] [phone] or [phone]   Substance Abuse Resources Organization         Address  Phone  Notes  [facility]  [phone]   [facility]  [phone]   [facility]  [phone]   [facility]  [phone]   Residential & Outpatient Substance Abuse Program  [phone]   Psychological Services Organization         Address  Phone  Notes  [facility]  [phone]- [phone]   [facility]  [phone]- [phone]   [facility] [address], [place] [phone] or [phone]    Mobile Crisis Teams Organization         Address  Phone  Notes  [facility], Mobile Crisis Care Unit  [phone]   [facility]  [address] [place], [place] [phone]   Doristine LocksSharon DeEsch [address] [place] [phone]    Self-Help/Support Groups Organization         Address  Phone             Notes  [facility] [place] - variety of support groups  336- [phone] Call for more information  Narcotics Anonymous (NA), [facility] [address], [place]  2 meetings at this location   Statisticianesidential Treatment Programs Organization         Address  Phone  Notes  [facility] [address],    [place]  [phone]   [facility]  [address], [place] [phone], [place], [place] [phone]   [facility] [address], [place] [phone] Admissions: 8am-3pm M-F  [facility] [address]  [place], [place]   [facility] [address] Jadene Pierini  [place], [place]   The [place].,  [place], [place]   [facility] - Intensive Outpatient [address], [place], [place]   [facility] [address]  [place], [place] [phone] or [phone]   [facility] ([facility]) [address], [place], [place] Accepts Medicaid  [facility] [address]  [place] [phone] Substance Abuse/Addiction Treatment   [facility] Organization         Address  Phone  Notes  [facility]  [phone]   Domenic Schwab, PhD [address] Arlis Porta [place], [place]   [phone] or [phone]   [facility] [place], [place] [phone]   [facility] [phone], [place], [place] [phone] Insurance/Medicaid/sponsorship through [facility] and Families [address], [address]                                    [place], [place] [phone] [place] [address], [place] [phone]    Dr. Adele Schilder  [phone]   [facility] Dept. 1) [address], [place] 2) [place] 3)  [address], [place] [phone]  [phone]   [place] [phone] or [phone] (After Hours)

## 2013-11-16 NOTE — ED Notes (Signed)
Ortho tech at bedside 

## 2013-11-16 NOTE — ED Provider Notes (Signed)
CSN: [id number]     Arrival date & time 11/16/13  0745 History  This chart was scribed for non-physician practitioner, Trixie Dredge, PA-C working with Lyanne Co, MD by Greggory Stallion, ED scribe. This patient was seen in room TR10C/TR10C and the patient's care was started at 9:09 AM.   Chief Complaint  Patient presents with  . Fall  . Ankle Pain   The history is provided by the patient. No language interpreter was used.   HPI Comments: Vincent Mcpherson is a 33 y.o. male who presents to the Emergency Department complaining of bilateral ankle injuries that occurred last night. He states he slipped on ice, fell and twisted both ankles. Pt heard a pop in his left ankle. Denies hitting his head or LOC. He has sudden onset, aching, throbbing bilateral ankle pain, left worse than right. Rates the pain 8/10 on the left and 4/10 on the right. Pt has not taken anything for pain. Denies fever, chest pain, abdominal pain, weakness or numbness.   Past Medical History  Diagnosis Date  . Hypertension   . Atrial fibrillation   . Palpitations   . Syncope 05/04/12    while [facility]   History reviewed. No pertinent past surgical history. History reviewed. No pertinent family history. History  Substance Use Topics  . Smoking status: Current Every Day Smoker -- 1.00 packs/day  . Smokeless tobacco: Not on file  . Alcohol Use: Yes    Review of Systems  Constitutional: Negative for fever.  Respiratory: Negative for shortness of breath.   Cardiovascular: Negative for chest pain.  Musculoskeletal: Positive for arthralgias.  Neurological: Negative for weakness and numbness.  All other systems reviewed and are negative.   Allergies  Penicillins  Home Medications   Current Outpatient Rx  Name  Route  Sig  Dispense  Refill  . naproxen sodium (ALEVE) 220 MG tablet   Oral   Take 220 mg by mouth daily as needed (headache). PRN          BP 136/85  Pulse 98  Temp(Src) 97.8 F  (36.6 C) (Oral)  Resp 18  Physical Exam  Nursing note and vitals reviewed. Constitutional: He appears well-developed and well-nourished. No distress.  HENT:  Head: Normocephalic and atraumatic.  Neck: Neck supple.  Cardiovascular: Intact distal pulses.   Pulmonary/Chest: Effort normal.  Musculoskeletal: Normal range of motion. He exhibits no edema.  Left lateral malleolus tenderness and anterior ankle tender. Joint stable. Pulses intact. Moves all toes. Right lateral malleolus tenderness and anterior ankle tender. Joint stable. Pulses intact. Moves all toes. Sensation intact throughout  Neurological: He is alert.  Sensation intact.   Skin: He is not diaphoretic.  Psychiatric: He has a normal mood and affect.    ED Course  Procedures (including critical care time)  DIAGNOSTIC STUDIES:   COORDINATION OF CARE: 9:12 AM-Discussed treatment plan which includes xrays with pt at bedside and pt agreed to plan.   Labs Review Labs Reviewed - No data to display Imaging Review Dg Ankle Complete Left  11/16/2013   CLINICAL DATA:  Pain post fall  EXAM: LEFT ANKLE COMPLETE - 3+ VIEW  COMPARISON:  None.  FINDINGS: There is no evidence of fracture, dislocation, or joint effusion. There is no evidence of arthropathy or other focal bone abnormality. Soft tissues are unremarkable.  IMPRESSION: Negative.   Electronically Signed   By: Oley Balm M.D.   On: 11/16/2013 09:54   Dg Ankle Complete Right  11/16/2013   CLINICAL DATA:  Pain post fall  EXAM: RIGHT ANKLE - COMPLETE 3+ VIEW  COMPARISON:  None.  FINDINGS: There is no evidence of fracture, dislocation, or joint effusion. Small calcaneal spur. There is no evidence of arthropathy or other focal bone abnormality. Soft tissues are unremarkable.  IMPRESSION: Negative.   Electronically Signed   By: Oley Balmaniel  Hassell M.D.   On: 11/16/2013 09:54     EKG Interpretation None      MDM   Final diagnoses:  Bilateral ankle pain    Pt with slip  and fall on ice, injuring bilateral ankles.  Occurred last night.  Pain on left worse than right.  Exam identical bilaterally, lateral malleolar and anterior tenderness.  Joints stable.  Neurovascularly intact.  Xrays negative.  Placed in left ASO and crutches, d/c home with ibuprofen #10 norco, PCP follow up.  Discussed result, findings, treatment, and follow up  with patient.  Pt given return precautions.  Pt verbalizes understanding and agrees with plan.      I personally performed the services described in this documentation, which was scribed in my presence. The recorded information has been reviewed and is accurate.   Trixie Dredgemily Mikailah Morel, PA-C 11/16/13 1116

## 2013-11-16 NOTE — Progress Notes (Signed)
Orthopedic Tech Progress Note Patient Details:  Vincent LeveringCameron D Mcpherson 11/09/1980 [id number]  Ortho Devices Type of Ortho Device: ASO;Crutches Ortho Device/Splint Interventions: Application   Asia R Janee Mornhompson 11/16/2013, 10:24 AM

## 2013-11-16 NOTE — ED Notes (Signed)
Reports falling and twisting bilateral ankles last night, heard a "pop" to left ankle.

## 2013-11-16 NOTE — ED Provider Notes (Signed)
Medical screening examination/treatment/procedure(s) were performed by non-physician practitioner and as supervising physician I was immediately available for consultation/collaboration.   EKG Interpretation None        Ralynn San M Shashank Kwasnik, MD 11/16/13 1637 

## 2014-04-15 ENCOUNTER — Encounter (HOSPITAL_COMMUNITY): Payer: Self-pay | Admitting: Emergency Medicine

## 2014-04-15 ENCOUNTER — Emergency Department (HOSPITAL_COMMUNITY)
Admission: EM | Admit: 2014-04-15 | Discharge: 2014-04-15 | Disposition: A | Discharge status: Home / Self Care | Payer: Self-pay | Attending: Emergency Medicine | Admitting: Emergency Medicine

## 2014-04-15 DIAGNOSIS — I10 Essential (primary) hypertension: Secondary | ICD-10-CM | POA: Insufficient documentation

## 2014-04-15 DIAGNOSIS — Z880 Allergy status to penicillin: Secondary | ICD-10-CM | POA: Insufficient documentation

## 2014-04-15 DIAGNOSIS — L03115 Cellulitis of right lower limb: Secondary | ICD-10-CM

## 2014-04-15 DIAGNOSIS — M79609 Pain in unspecified limb: Secondary | ICD-10-CM | POA: Insufficient documentation

## 2014-04-15 DIAGNOSIS — F17200 Nicotine dependence, unspecified, uncomplicated: Secondary | ICD-10-CM | POA: Insufficient documentation

## 2014-04-15 DIAGNOSIS — L03119 Cellulitis of unspecified part of limb: Principal | ICD-10-CM

## 2014-04-15 DIAGNOSIS — L02419 Cutaneous abscess of limb, unspecified: Secondary | ICD-10-CM | POA: Insufficient documentation

## 2014-04-15 DIAGNOSIS — I82401 Acute embolism and thrombosis of unspecified deep veins of right lower extremity: Secondary | ICD-10-CM

## 2014-04-15 DIAGNOSIS — I82409 Acute embolism and thrombosis of unspecified deep veins of unspecified lower extremity: Secondary | ICD-10-CM | POA: Insufficient documentation

## 2014-04-15 DIAGNOSIS — Z791 Long term (current) use of non-steroidal anti-inflammatories (NSAID): Secondary | ICD-10-CM | POA: Insufficient documentation

## 2014-04-15 HISTORY — DX: Asymptomatic varicose veins of unspecified lower extremity: I83.90

## 2014-04-15 MED ORDER — RIVAROXABAN 15 MG PO TABS
15.0000 mg | ORAL_TABLET | Freq: Once | ORAL | Status: AC
  Administered 2014-04-15: 15 mg via ORAL
  Filled 2014-04-15: qty 1

## 2014-04-15 MED ORDER — SULFAMETHOXAZOLE-TMP DS 800-160 MG PO TABS
1.0000 | ORAL_TABLET | Freq: Once | ORAL | Status: AC
  Administered 2014-04-15: 1 via ORAL
  Filled 2014-04-15: qty 1

## 2014-04-15 MED ORDER — SULFAMETHOXAZOLE-TRIMETHOPRIM 800-160 MG PO TABS: 1.0000 | ORAL_TABLET | Freq: Two times a day (BID) | ORAL | Status: DC

## 2014-04-15 MED ORDER — ENOXAPARIN SODIUM 100 MG/ML ~~LOC~~ SOLN: 190.0000 mg | Freq: Once | SUBCUTANEOUS | Status: DC

## 2014-04-15 MED ORDER — ENOXAPARIN SODIUM 100 MG/ML ~~LOC~~ SOLN: 125.0000 mg | Freq: Once | SUBCUTANEOUS | Status: DC

## 2014-04-15 NOTE — Progress Notes (Signed)
ED CM consulted by Dr. Eliane Decree concerning patient. Patient presented to [facility] ED with right leg swelling and pain. DVT suspected as per Dr. Tomi Bamberger, unable to get dopplers tonight. Patient will be given a dose of Xarelto and come back in the am for Venous Doppler studies.  As per, Dr. Eliane Decree If studies positive patient will need to return to ED to get prescription for Xarelto. Met with patient at bedside. Pt reports being without a PCP or Insurance. Discussed Xarelto free 30-day trial. Pt is agreeable if medication is needed. Provided a free trial card explained that if patient should need to be started on xarelto he will present card along with prescription. If patient does not need the medication he can discard the card Pt verbalized understanding teach back done.  Also Discussed with patient importance of f/u with PCP to manage DVT.  Pt verbalized understanding and is agreeable. Discussed the [facility] and the services provided. Brochure given for the [facility] with address, phone number, Informed patient of the  [place] Counselor on site who will assist with [employer] process.  Pt verbalized understanding and appreciation for the assistance. Clinic. Instructed patient to  walk in tomorrow after doppler studies tomorrow  to schedule appt for establishing care for f/u. Pt verbalized understanding. Discussed plan with  Dr. Eliane Decree she is agreeable.

## 2014-04-15 NOTE — Discharge Instructions (Signed)
Elevate your leg. Use warm compress on the red area. You will get a call in the morning to return to get the test to check your right leg for a blood clot. If you don't have a clot finish the antibiotics, if you have a clot, you will be directed back to the ED.

## 2014-04-15 NOTE — ED Provider Notes (Signed)
CSN: [id number]     Arrival date & time 04/15/14  1420 History   First MD Initiated Contact with Patient 04/15/14 1832     Chief Complaint  Patient presents with  . Leg Pain  . Numbness     (Consider location/radiation/quality/duration/timing/severity/associated sxs/prior Treatment) HPI Pt reports 4 days ago he started having a knot in his medial right knee and now has redness and swelling extending inferiorly from that area that started 2 days later. He denies any fever or chills. He denies any recent travel. He denies any recent insect bites. He denies any recent injury. He denies chest pain, shortness of breath, headache, itching. He does not have a history of skin infections or boils.  Patient also states sometimes his right arm tingles. He also feels like his left ring finger gets a shocking sensation in the tip of the finger. These are both intermittent and have been coming and going for past several days. Patient states he is left-handed.  PCP none  Past Medical History  Diagnosis Date  . Hypertension   . Atrial fibrillation   . Palpitations   . Syncope 05/04/12    while visiting another pt at [facility]  . Varicose vein    History reviewed. No pertinent past surgical history. History reviewed. No pertinent family history. History  Substance Use Topics  . Smoking status: Current Every Day Smoker -- 1.00 packs/day  . Smokeless tobacco: Not on file  . Alcohol Use: Yes  unemployed drinks 2-3 beers a day  Review of Systems  All other systems reviewed and are negative.     Allergies  Penicillins  Home Medications   Prior to Admission medications   Medication Sig Start Date End Date Taking? Authorizing Provider  ibuprofen (ADVIL,MOTRIN) 800 MG tablet Take 1 tablet (800 mg total) by mouth every 8 (eight) hours as needed for mild pain or moderate pain. 11/16/13  Yes Trixie Dredge, PA-C  naproxen sodium (ALEVE) 220 MG tablet Take 220 mg by mouth daily as needed (headache). PRN    Yes Historical Provider, MD   BP 119/79  Pulse 81  Temp(Src) 98 F (36.7 C) (Oral)  Resp 16  Wt 277 lb (125.646 kg)  SpO2 98%  Vital signs normal   Physical Exam  Nursing note and vitals reviewed. Constitutional: He is oriented to person, place, and time. He appears well-developed and well-nourished.  Non-toxic appearance. He does not appear ill. No distress.  HENT:  Head: Normocephalic and atraumatic.  Right Ear: External ear normal.  Left Ear: External ear normal.  Nose: Nose normal. No mucosal edema or rhinorrhea.  Mouth/Throat: Oropharynx is clear and moist and mucous membranes are normal. No dental abscesses or uvula swelling.  Eyes: Conjunctivae and EOM are normal. Pupils are equal, round, and reactive to light.  Neck: Normal range of motion and full passive range of motion without pain. Neck supple.  Cardiovascular: Normal rate, regular rhythm and normal heart sounds.  Exam reveals no gallop and no friction rub.   No murmur heard. Pulmonary/Chest: Effort normal and breath sounds normal. No respiratory distress. He has no wheezes. He has no rhonchi. He has no rales. He exhibits no tenderness and no crepitus.  Abdominal: Soft. Normal appearance and bowel sounds are normal. He exhibits no distension. There is no tenderness. There is no rebound and no guarding.  Musculoskeletal: Normal range of motion. He exhibits edema and tenderness.  Moves all extremities well. Patient noted to have varicosities in both lower legs. He  has a firm knot in the medial aspect of his left knee that has inferior redness and warmth of the skin. Please see photos  Neurological: He is alert and oriented to person, place, and time. He has normal strength. No cranial nerve deficit.  Skin: Skin is warm, dry and intact. No rash noted. No erythema. No pallor.  Psychiatric: He has a normal mood and affect. His speech is normal and behavior is normal. His mood appears not anxious.         ED Course    Procedures (including critical care time)  Medications  sulfamethoxazole-trimethoprim (BACTRIM DS) 800-160 MG per tablet 1 tablet (not administered)  Rivaroxaban (XARELTO) tablet 15 mg (15 mg Oral Given 04/15/14 2019)   Pt discussed with the pharmacist, we were going to start him on lovenox, but suggests xarelto. Pt is unemployed and doesn't have a PCP.   19:45 Burna MortimerWanda Case Manager will talk to patient and [facility] and [facility]to starter pack  Labs Review Labs Reviewed - No data to display  Imaging Review No results found.   EKG Interpretation None      MDM   Final diagnoses:  Cellulitis of right lower extremity  DVT (deep venous thrombosis), right    New Prescriptions   SULFAMETHOXAZOLE-TRIMETHOPRIM (SEPTRA DS) 800-160 MG PER TABLET    Take 1 tablet by mouth every 12 (twelve) hours.    Plan discharge   Devoria AlbeIva Male Minish, MD, Franz DellFACEP     Kacelyn Rowzee L Syrah Daughtrey, MD 04/15/14 2036

## 2014-04-15 NOTE — ED Notes (Signed)
Pt wanting to leave. Informed that we are waiting on case management to come and help him be able to get his medications for free. Pt encouraged to stay in order to get prescriptions. Pt willing to stay for now.

## 2014-04-15 NOTE — ED Notes (Signed)
Pt reports pain that started to right lower leg on Friday, redness noted to leg. Pt ambulatory at triage. Also reports intermittent numbness sensations to right arm and left ring finger tip. No neuro deficits noted at triage.

## 2014-04-16 ENCOUNTER — Encounter (HOSPITAL_COMMUNITY): Payer: Self-pay

## 2014-04-16 ENCOUNTER — Ambulatory Visit (HOSPITAL_COMMUNITY): Payer: Self-pay

## 2014-04-16 MED ORDER — RIVAROXABAN (XARELTO) VTE STARTER PACK (15 & 20 MG): ORAL_TABLET | ORAL | Status: DC

## 2014-04-16 NOTE — ED Provider Notes (Signed)
1535 - I spoke with Wanda from Case Mgmt. HBurna Mortimere couldn't obtain his DVT US today because he had to go to work. He received one dose of Xarelto last night, but he didn't get a Rx because he was supposed to have confirmatory US today. Given Rx to continue xarelto until he can get US tomorrow morning. Case Mgmt can get one month supply for free, so given the Starter Pack Rx. Case Mgmt give instructions for use.  Dagmar HaitWilliam Loyce Klasen, MD 04/16/14 1537

## 2014-04-17 ENCOUNTER — Ambulatory Visit (HOSPITAL_COMMUNITY)
Admission: RE | Admit: 2014-04-17 | Discharge: 2014-04-17 | Disposition: A | Discharge status: Home / Self Care | Payer: Self-pay | Source: Ambulatory Visit | Attending: Emergency Medicine | Admitting: Emergency Medicine

## 2014-04-17 DIAGNOSIS — I8000 Phlebitis and thrombophlebitis of superficial vessels of unspecified lower extremity: Secondary | ICD-10-CM | POA: Insufficient documentation

## 2014-04-17 NOTE — Progress Notes (Signed)
Late Entry ED CM contacted patients regarding  Doppler studies f/u and Xarelto. Patient and Girlfriend on speaker phone reported Doppler study to r/o DVT rescheduled for tomorrow 7/30 at 11. Discussed with Dr. Gwendolyn GrantWalden, A prescription was written for the Xarelto start pack,and patient was instructed to continue to Xarelto. Once Doppler studies are completed if venous Doppler is negative patient  instructed to DISCONTINUE Xarelto. Patient and girlfriend  verbalized understanding teach back was done. [facility] [address] [phone]. Faxed confirmation received. ED CM will follow up with patient tomorrow afternoon concerning f/u.

## 2014-04-17 NOTE — Progress Notes (Signed)
ED CM contacted patient by phone. Doppler studies neg. Confirmed with patient, discontinue Xarelto. Verified f/u PCP appointment Tuesday 8/4 at 445p Pt verbalized understanding teach back done. No further ED CM needs identified

## 2014-04-22 ENCOUNTER — Inpatient Hospital Stay: Payer: Self-pay | Admitting: Internal Medicine

## 2014-11-10 ENCOUNTER — Emergency Department (HOSPITAL_COMMUNITY)
Admission: EM | Admit: 2014-11-10 | Discharge: 2014-11-10 | Disposition: A | Discharge status: Home / Self Care | Payer: Self-pay | Attending: Emergency Medicine | Admitting: Emergency Medicine

## 2014-11-10 ENCOUNTER — Encounter (HOSPITAL_COMMUNITY): Payer: Self-pay | Admitting: Emergency Medicine

## 2014-11-10 ENCOUNTER — Emergency Department (HOSPITAL_COMMUNITY): Payer: Self-pay

## 2014-11-10 DIAGNOSIS — R0981 Nasal congestion: Secondary | ICD-10-CM | POA: Insufficient documentation

## 2014-11-10 DIAGNOSIS — R0789 Other chest pain: Secondary | ICD-10-CM | POA: Insufficient documentation

## 2014-11-10 DIAGNOSIS — I10 Essential (primary) hypertension: Secondary | ICD-10-CM | POA: Insufficient documentation

## 2014-11-10 DIAGNOSIS — Z720 Tobacco use: Secondary | ICD-10-CM | POA: Insufficient documentation

## 2014-11-10 DIAGNOSIS — Z7901 Long term (current) use of anticoagulants: Secondary | ICD-10-CM | POA: Insufficient documentation

## 2014-11-10 DIAGNOSIS — R079 Chest pain, unspecified: Secondary | ICD-10-CM

## 2014-11-10 DIAGNOSIS — Z880 Allergy status to penicillin: Secondary | ICD-10-CM | POA: Insufficient documentation

## 2014-11-10 DIAGNOSIS — I480 Paroxysmal atrial fibrillation: Secondary | ICD-10-CM | POA: Insufficient documentation

## 2014-11-10 DIAGNOSIS — R059 Cough, unspecified: Secondary | ICD-10-CM

## 2014-11-10 DIAGNOSIS — R05 Cough: Secondary | ICD-10-CM | POA: Insufficient documentation

## 2014-11-10 LAB — BASIC METABOLIC PANEL
Anion gap: 9 (ref 5–15)
BUN: 10 mg/dL (ref 6–23)
CHLORIDE: 107 mmol/L (ref 96–112)
CO2: 25 mmol/L (ref 19–32)
Calcium: 9.1 mg/dL (ref 8.4–10.5)
Creatinine, Ser: 1.1 mg/dL (ref 0.50–1.35)
GFR, EST NON AFRICAN AMERICAN: 87 mL/min — AB (ref >90)
Glucose, Bld: 95 mg/dL (ref 70–99)
Potassium: 4.1 mmol/L (ref 3.5–5.1)
Sodium: 141 mmol/L (ref 135–145)

## 2014-11-10 LAB — I-STAT TROPONIN, ED: Troponin i, poc: 0 ng/mL (ref 0.00–0.08)

## 2014-11-10 LAB — CBC WITH DIFFERENTIAL/PLATELET
BASOS PCT: 0 % (ref 0–1)
Basophils Absolute: 0 10*3/uL (ref 0.0–0.1)
EOS ABS: 0.1 10*3/uL (ref 0.0–0.7)
Eosinophils Relative: 2 % (ref 0–5)
HCT: 40.4 % (ref 39.0–52.0)
Hemoglobin: 12.9 g/dL — ABNORMAL LOW (ref 13.0–17.0)
LYMPHS PCT: 38 % (ref 12–46)
Lymphs Abs: 2.5 10*3/uL (ref 0.7–4.0)
MCH: 29.9 pg (ref 26.0–34.0)
MCHC: 31.9 g/dL (ref 30.0–36.0)
MCV: 93.7 fL (ref 78.0–100.0)
MONOS PCT: 6 % (ref 3–12)
Monocytes Absolute: 0.4 10*3/uL (ref 0.1–1.0)
NEUTROS ABS: 3.5 10*3/uL (ref 1.7–7.7)
Neutrophils Relative %: 54 % (ref 43–77)
Platelets: 227 10*3/uL (ref 150–400)
RBC: 4.31 MIL/uL (ref 4.22–5.81)
RDW: 12.7 % (ref 11.5–15.5)
WBC: 6.6 10*3/uL (ref 4.0–10.5)

## 2014-11-10 MED ORDER — HYDROCODONE-ACETAMINOPHEN 5-325 MG PO TABS
2.0000 | ORAL_TABLET | Freq: Once | ORAL | Status: AC
  Administered 2014-11-10: 2 via ORAL
  Filled 2014-11-10: qty 2

## 2014-11-10 MED ORDER — NAPROXEN 500 MG PO TABS: 500.0000 mg | ORAL_TABLET | Freq: Two times a day (BID) | ORAL | Status: DC

## 2014-11-10 MED ORDER — HYDROCODONE-ACETAMINOPHEN 5-325 MG PO TABS: 1.0000 | ORAL_TABLET | ORAL | Status: DC | PRN

## 2014-11-10 NOTE — Discharge Instructions (Signed)
Take the prescribed medication as directed. [facility]. Return to the ED for new or worsening symptoms.

## 2014-11-10 NOTE — ED Provider Notes (Signed)
CSN: [id number]     Arrival date & time 11/10/14  1316 History   First MD Initiated Contact with Patient 11/10/14 1520     Chief Complaint  Patient presents with  . Chest Pain  . Cough     (Consider location/radiation/quality/duration/timing/severity/associated sxs/prior Treatment) The history is provided by the patient and medical records.    This is a 34 y.o. M with PMH significant for HTN, paroxysmal A. fib, varicose veins, presenting to the ED for chest pain and cough for the past 2 days. Patient states approximately 1 week ago he began developing URI type symptoms, notably nasal congestion, sinus pressure, sneezing and a since developed nonproductive cough. He describes his chest pain as a "soreness" and relates it to "sensation when you pull a muscle". He states pain becomes worse with coughing, deep breathing, twisting motions, and sneezing. He denies any chest pain at rest, no exertional component. Patient has a history of A. fib, not currently on anticoagulation.  Patient states they thought he had a DVT last year, but it was a false positive. He has no history of documented DVT or PE. He denies any shortness of breath, palpitations, dizziness, weakness or feelings of syncope. No intervention tried prior to arrival.  VSS on arrival to ED.  Past Medical History  Diagnosis Date  . Hypertension   . Atrial fibrillation   . Palpitations   . Syncope 05/04/12    while visiting another pt at [facility]  . Varicose vein    History reviewed. No pertinent past surgical history. History reviewed. No pertinent family history. History  Substance Use Topics  . Smoking status: Current Every Day Smoker -- 1.00 packs/day  . Smokeless tobacco: Not on file  . Alcohol Use: Yes    Review of Systems  Respiratory: Positive for cough.   Cardiovascular: Positive for chest pain.  All other systems reviewed and are negative.     Allergies  Penicillins  Home Medications   Prior to Admission  medications   Medication Sig Start Date End Date Taking? Authorizing Provider  ibuprofen (ADVIL,MOTRIN) 800 MG tablet Take 1 tablet (800 mg total) by mouth every 8 (eight) hours as needed for mild pain or moderate pain. 11/16/13   Trixie Dredge, PA-C  naproxen sodium (ALEVE) 220 MG tablet Take 220 mg by mouth daily as needed (headache). PRN    Historical Provider, MD  Rivaroxaban 15 & 20 MG TBPK Take as directed on package: Start with one  tablet by mouth twice a day with food. On Day 22, switch to one  tablet once a day with food. 04/16/14   Elwin Mocha, MD  sulfamethoxazole-trimethoprim (SEPTRA DS) 800-160 MG per tablet Take 1 tablet by mouth every 12 (twelve) hours. 04/15/14   Ward Givens, MD   BP 123/88 mmHg  Pulse 78  Temp(Src) 98.1 F (36.7 C)  Resp 18  Ht  (1.778 m)  Wt 270 lb (122.471 kg)  BMI 38.74 kg/m2  SpO2 97%   Physical Exam  Constitutional: He is oriented to person, place, and time. He appears well-developed and well-nourished. No distress.  HENT:  Head: Normocephalic and atraumatic.  Right Ear: Tympanic membrane and ear canal normal.  Left Ear: Ear canal normal.  Nose: Mucosal edema present.  Mouth/Throat: Uvula is midline, oropharynx is clear and moist and mucous membranes are normal. No oropharyngeal exudate, posterior oropharyngeal edema, posterior oropharyngeal erythema or tonsillar abscesses.  Nasal congestion and PND noted; Tonsils normal in appearance bilaterally without exudate;  uvula midline without peritonsillar abscess; handling secretions appropriately; no difficulty swallowing or speaking  Eyes: Conjunctivae and EOM are normal. Pupils are equal, round, and reactive to light.  Neck: Normal range of motion.  Cardiovascular: Normal rate, regular rhythm and normal heart sounds.   Pulmonary/Chest: Effort normal and breath sounds normal. No respiratory distress. He has no wheezes.  Anterior chest wall somewhat TTP without bony deformity  Abdominal: Soft.  Bowel sounds are normal. There is no tenderness. There is no guarding.  Musculoskeletal: Normal range of motion.  Neurological: He is alert and oriented to person, place, and time.  Skin: Skin is warm and dry. He is not diaphoretic.  Psychiatric: He has a normal mood and affect.  Nursing note and vitals reviewed.   ED Course  Procedures (including critical care time) Labs Review Labs Reviewed  BASIC METABOLIC PANEL - Abnormal; Notable for the following:    GFR calc non Af Amer 87 (*)    All other components within normal limits  CBC WITH DIFFERENTIAL/PLATELET - Abnormal; Notable for the following:    Hemoglobin 12.9 (*)    All other components within normal limits  Rosezena SensorI-STAT TROPOININ, ED    Imaging Review Dg Chest 2 View  11/10/2014   CLINICAL DATA:  34 year old male with chest pain and cough for 2 days. Initial encounter.  EXAM: CHEST  2 VIEW  COMPARISON:  Portable chest radiograph 05/04/2012.  FINDINGS: Stable cardiac size at the upper limits of normal. Other mediastinal contours are within normal limits. Visualized tracheal air column is within normal limits. No pneumothorax, pulmonary edema, pleural effusion or confluent pulmonary opacity. No acute osseous abnormality identified.  IMPRESSION: No acute cardiopulmonary abnormality.   Electronically Signed   By: Odessa FlemingH  Hall M.D.   On: 11/10/2014 15:13     EKG Interpretation None       Date: 11/10/2014  Rate: 79  Rhythm: normal sinus rhythm  QRS Axis: normal  Intervals: normal  ST/T Wave abnormalities: normal  Conduction Disutrbances:none  Narrative Interpretation:   Old EKG Reviewed: unchanged   MDM   Final diagnoses:  Chest pain, unspecified chest pain type  Cough   34 year old male with pleuritic sounding chest pain over the past 2 days. He does have history of paroxysmal A. fib, normal sinus rhythm in ED today. His lab work is reassuring, troponin negative. Chest x-ray is clear. On exam, he has mild tenderness of his  anterior chest wall without noted deformities. His vital signs are stable on room air, no hypoxia or tachycardia. HEART score of 1, PERC negative. At this time, low suspicion for ACS, PE, dissection, or other acute cardiac event, more likely MSK etiology of his chest pain secondary to cough/URI.  Patient given short supply pain meds, NSAIDs.  [facility]  Discussed plan with patient, he/she acknowledged understanding and agreed with plan of care.  Return precautions given for new or worsening symptoms.  Garlon HatchetLisa M Sanders, PA-C 11/10/14 1648  Dione Boozeavid Glick, MD 11/11/14 0001

## 2014-11-10 NOTE — ED Notes (Signed)
Pt sts CP with movement and cough x 2 days

## 2014-11-26 ENCOUNTER — Encounter (HOSPITAL_COMMUNITY): Payer: Self-pay | Admitting: *Deleted

## 2014-11-26 ENCOUNTER — Emergency Department (HOSPITAL_COMMUNITY): Payer: Self-pay

## 2014-11-26 ENCOUNTER — Emergency Department (HOSPITAL_COMMUNITY)
Admission: EM | Admit: 2014-11-26 | Discharge: 2014-11-26 | Disposition: A | Discharge status: Home / Self Care | Payer: Self-pay | Attending: Emergency Medicine | Admitting: Emergency Medicine

## 2014-11-26 DIAGNOSIS — Z791 Long term (current) use of non-steroidal anti-inflammatories (NSAID): Secondary | ICD-10-CM | POA: Insufficient documentation

## 2014-11-26 DIAGNOSIS — I10 Essential (primary) hypertension: Secondary | ICD-10-CM | POA: Insufficient documentation

## 2014-11-26 DIAGNOSIS — J40 Bronchitis, not specified as acute or chronic: Secondary | ICD-10-CM | POA: Insufficient documentation

## 2014-11-26 DIAGNOSIS — I4891 Unspecified atrial fibrillation: Secondary | ICD-10-CM | POA: Insufficient documentation

## 2014-11-26 DIAGNOSIS — Z880 Allergy status to penicillin: Secondary | ICD-10-CM | POA: Insufficient documentation

## 2014-11-26 DIAGNOSIS — M549 Dorsalgia, unspecified: Secondary | ICD-10-CM | POA: Insufficient documentation

## 2014-11-26 DIAGNOSIS — R079 Chest pain, unspecified: Secondary | ICD-10-CM | POA: Insufficient documentation

## 2014-11-26 DIAGNOSIS — Z720 Tobacco use: Secondary | ICD-10-CM | POA: Insufficient documentation

## 2014-11-26 MED ORDER — ALBUTEROL SULFATE HFA 108 (90 BASE) MCG/ACT IN AERS
2.0000 | INHALATION_SPRAY | Freq: Four times a day (QID) | RESPIRATORY_TRACT | Status: DC
  Administered 2014-11-26: 2 via RESPIRATORY_TRACT
  Filled 2014-11-26: qty 6.7

## 2014-11-26 MED ORDER — DM-GUAIFENESIN ER 30-600 MG PO TB12
1.0000 | ORAL_TABLET | Freq: Two times a day (BID) | ORAL | Status: DC
Start: 2014-11-26 — End: 2017-03-20

## 2014-11-26 MED ORDER — NAPROXEN 500 MG PO TABS: 500.0000 mg | ORAL_TABLET | Freq: Two times a day (BID) | ORAL | Status: DC

## 2014-11-26 NOTE — ED Provider Notes (Signed)
CSN: [id number]     Arrival date & time 11/26/14  [id number] History   First MD Initiated Contact with Patient 11/26/14 0932     Chief Complaint  Patient presents with  . Cough  . Chest Pain     (Consider location/radiation/quality/duration/timing/severity/associated sxs/prior Treatment) Patient is a 34 y.o. male presenting with cough and chest pain. The history is provided by the patient.  Cough Associated symptoms: chest pain   Associated symptoms: no fever and no headaches   Chest Pain Associated symptoms: back pain and cough   Associated symptoms: no abdominal pain, no fever, no headache, no nausea and not vomiting    patient with a cough occasionally productive. Patient with the bilateral lower chest pain made worse with the cough. Symptoms started on Sunday. No nausea no vomiting no diarrhea no significant headache. No fevers.  Past Medical History  Diagnosis Date  . Hypertension   . Atrial fibrillation   . Palpitations   . Syncope 05/04/12    while [facility]  . Varicose vein    History reviewed. No pertinent past surgical history. History reviewed. No pertinent family history. History  Substance Use Topics  . Smoking status: Current Every Day Smoker -- 1.00 packs/day  . Smokeless tobacco: Not on file  . Alcohol Use: Yes    Review of Systems  Constitutional: Negative for fever.  HENT: Positive for congestion.   Eyes: Negative for redness.  Respiratory: Positive for cough.   Cardiovascular: Positive for chest pain.  Gastrointestinal: Negative for nausea, vomiting and abdominal pain.  Genitourinary: Negative for dysuria.  Musculoskeletal: Positive for back pain.  Neurological: Negative for headaches.  Hematological: Does not bruise/bleed easily.  Psychiatric/Behavioral: Negative for confusion.      Allergies  Penicillins  Home Medications   Prior to Admission medications   Medication Sig Start Date End Date Taking? Authorizing Provider   dextromethorphan-guaiFENesin (MUCINEX DM) 30-600 MG per 12 hr tablet Take 1 tablet by mouth 2 (two) times daily. 11/26/14   Vanetta MuldersScott Timiya Howells, MD  HYDROcodone-acetaminophen (NORCO/VICODIN) 5-325 MG per tablet Take 1 tablet by mouth every 4 (four) hours as needed. 11/10/14   Garlon HatchetLisa M Sanders, PA-C  ibuprofen (ADVIL,MOTRIN) 800 MG tablet Take 1 tablet (800 mg total) by mouth every 8 (eight) hours as needed for mild pain or moderate pain. 11/16/13   Trixie DredgeEmily West, PA-C  naproxen (NAPROSYN) 500 MG tablet Take 1 tablet (500 mg total) by mouth 2 (two) times daily with a meal. 11/10/14   Garlon HatchetLisa M Sanders, PA-C  naproxen (NAPROSYN) 500 MG tablet Take 1 tablet (500 mg total) by mouth 2 (two) times daily. 11/26/14   Vanetta MuldersScott Zuley Lutter, MD  naproxen sodium (ALEVE) 220 MG tablet Take 220 mg by mouth daily as needed (headache). PRN    Historical Provider, MD  Rivaroxaban 15 & 20 MG TBPK Take as directed on package: Start with one 15mg  tablet by mouth twice a day with food. On Day 22, switch to one 20mg  tablet once a day with food. 04/16/14   Elwin MochaBlair Walden, MD  sulfamethoxazole-trimethoprim (SEPTRA DS) 800-160 MG per tablet Take 1 tablet by mouth every 12 (twelve) hours. 04/15/14   Devoria AlbeIva Knapp, MD   BP 120/70 mmHg  Pulse 78  Temp(Src) 98.7 F (37.1 C) (Oral)  Resp 16  Ht 5\' 10"  (1.778 m)  Wt 270 lb (122.471 kg)  BMI 38.74 kg/m2  SpO2 98% Physical Exam  Constitutional: He is oriented to person, place, and time. He appears well-developed  and well-nourished. No distress.  HENT:  Head: Normocephalic and atraumatic.  Mouth/Throat: Oropharynx is clear and moist.  Eyes: Conjunctivae are normal. Pupils are equal, round, and reactive to light.  Neck: Normal range of motion.  Cardiovascular: Normal rate, regular rhythm and normal heart sounds.   Pulmonary/Chest: Breath sounds normal. No respiratory distress. He has no wheezes. He has no rales.  Abdominal: Soft. Bowel sounds are normal. There is no tenderness.  Musculoskeletal: Normal  range of motion. He exhibits no edema.  Neurological: He is alert and oriented to person, place, and time. No cranial nerve deficit. He exhibits normal muscle tone. Coordination normal.  Skin: Skin is warm. No rash noted.  Nursing note and vitals reviewed.   ED Course  Procedures (including critical care time) Labs Review Labs Reviewed - No data to display  Imaging Review Dg Chest 2 View  11/26/2014   CLINICAL DATA:  Upper abdominal and chest pain for 4 days. Cough and chills.  EXAM: CHEST  2 VIEW  COMPARISON:  PA and lateral chest 11/10/2014. Single view of the chest 05/04/2012.  FINDINGS: The lungs are clear. Heart size is normal. There is no pneumothorax or pleural effusion.  IMPRESSION: No acute disease.   Electronically Signed   By: Drusilla Kanner M.D.   On: 11/26/2014 10:01     EKG Interpretation   Date/Time:  Wednesday November 26 2014 10:00:06 EST Ventricular Rate:  81 PR Interval:  154 QRS Duration: 107 QT Interval:  413 QTC Calculation: 479 R Axis:   60 Text Interpretation:  Sinus rhythm Ventricular premature complex  Borderline T wave abnormalities Borderline prolonged QT interval Confirmed  by Cypress Fanfan  MD, Jyquan Kenley ([id number]) on 11/26/2014 10:08:56 AM      MDM   Final diagnoses:  Chest pain  Bronchitis    Patient chest x-ray negative for pneumonia. Symptoms consistent with a viral upper respiratory bronchitis. Chest pain is chest wall in nature. EKG without any acute findings. Will treat with albuterol inhaler and Mucinex DM and Naprosyn. Patient nontoxic no acute distress. No hypoxia.   Vanetta Mulders, MD 11/26/14 1034

## 2014-11-26 NOTE — ED Notes (Signed)
Pt returned from xray and placed back on the monitor.  

## 2014-11-26 NOTE — ED Notes (Signed)
Patient transported to X-ray without distress.  

## 2014-11-26 NOTE — ED Notes (Addendum)
Pt is smoker 1 ppd and has generalized chest pain started with coughing and dimisihed breath sounds. Pt is diaphoretic but afebrile. Was seen here few weeks ago for chest pain and no cough, then cough started.

## 2014-11-26 NOTE — ED Notes (Signed)
Pt reports cough and cold symptoms for several days, having chest pain when he breaths and coughs. Reports productive cough with clear sputum.

## 2014-11-26 NOTE — ED Notes (Signed)
PT ambulated with baseline gait; VSS; A&Ox3; no signs of distress; respirations even and unlabored; skin warm and dry; no questions upon discharge.  

## 2014-11-26 NOTE — Discharge Instructions (Signed)
Symptoms consistent with bronchitis. Take the Mucinex DM as directed. Take the Naprosyn as needed for the chest wall pain. Take albuterol inhaler 2 puffs every 6 hours for the next 7 days. Return for any new or worse symptoms.

## 2014-11-26 NOTE — ED Notes (Signed)
I gave the patient 2 containers of orange juice per nurse Irving BurtonEmily.

## 2016-01-13 ENCOUNTER — Emergency Department (HOSPITAL_COMMUNITY): Payer: Self-pay

## 2016-01-13 ENCOUNTER — Emergency Department (HOSPITAL_COMMUNITY)
Admission: EM | Admit: 2016-01-13 | Discharge: 2016-01-14 | Disposition: A | Discharge status: Home / Self Care | Payer: Self-pay | Attending: Emergency Medicine | Admitting: Emergency Medicine

## 2016-01-13 ENCOUNTER — Encounter (HOSPITAL_COMMUNITY): Payer: Self-pay | Admitting: Emergency Medicine

## 2016-01-13 DIAGNOSIS — F1410 Cocaine abuse, uncomplicated: Secondary | ICD-10-CM | POA: Insufficient documentation

## 2016-01-13 DIAGNOSIS — F17200 Nicotine dependence, unspecified, uncomplicated: Secondary | ICD-10-CM | POA: Insufficient documentation

## 2016-01-13 DIAGNOSIS — F419 Anxiety disorder, unspecified: Secondary | ICD-10-CM | POA: Insufficient documentation

## 2016-01-13 DIAGNOSIS — Z880 Allergy status to penicillin: Secondary | ICD-10-CM | POA: Insufficient documentation

## 2016-01-13 DIAGNOSIS — R0602 Shortness of breath: Secondary | ICD-10-CM

## 2016-01-13 DIAGNOSIS — H538 Other visual disturbances: Secondary | ICD-10-CM | POA: Insufficient documentation

## 2016-01-13 DIAGNOSIS — F1210 Cannabis abuse, uncomplicated: Secondary | ICD-10-CM | POA: Insufficient documentation

## 2016-01-13 DIAGNOSIS — I10 Essential (primary) hypertension: Secondary | ICD-10-CM | POA: Insufficient documentation

## 2016-01-13 LAB — CBC WITH DIFFERENTIAL/PLATELET
BASOS ABS: 0 10*3/uL (ref 0.0–0.1)
Basophils Relative: 0 %
EOS ABS: 0.1 10*3/uL (ref 0.0–0.7)
EOS PCT: 1 %
HCT: 41.7 % (ref 39.0–52.0)
HEMOGLOBIN: 13.9 g/dL (ref 13.0–17.0)
LYMPHS ABS: 4 10*3/uL (ref 0.7–4.0)
LYMPHS PCT: 39 %
MCH: 30.4 pg (ref 26.0–34.0)
MCHC: 33.3 g/dL (ref 30.0–36.0)
MCV: 91.2 fL (ref 78.0–100.0)
Monocytes Absolute: 0.6 10*3/uL (ref 0.1–1.0)
Monocytes Relative: 6 %
NEUTROS PCT: 54 %
Neutro Abs: 5.5 10*3/uL (ref 1.7–7.7)
PLATELETS: 292 10*3/uL (ref 150–400)
RBC: 4.57 MIL/uL (ref 4.22–5.81)
RDW: 12.9 % (ref 11.5–15.5)
WBC: 10.2 10*3/uL (ref 4.0–10.5)

## 2016-01-13 LAB — RAPID URINE DRUG SCREEN, HOSP PERFORMED
AMPHETAMINES: NOT DETECTED
BENZODIAZEPINES: NOT DETECTED
Barbiturates: NOT DETECTED
COCAINE: POSITIVE — AB
OPIATES: NOT DETECTED
TETRAHYDROCANNABINOL: POSITIVE — AB

## 2016-01-13 LAB — BASIC METABOLIC PANEL
ANION GAP: 13 (ref 5–15)
BUN: 13 mg/dL (ref 6–20)
CHLORIDE: 100 mmol/L — AB (ref 101–111)
CO2: 24 mmol/L (ref 22–32)
Calcium: 9.9 mg/dL (ref 8.9–10.3)
Creatinine, Ser: 1.12 mg/dL (ref 0.61–1.24)
GFR calc Af Amer: >60 mL/min (ref >60)
Glucose, Bld: 71 mg/dL (ref 65–99)
POTASSIUM: 4.1 mmol/L (ref 3.5–5.1)
SODIUM: 137 mmol/L (ref 135–145)

## 2016-01-13 MED ORDER — ONDANSETRON 4 MG PO TBDP
4.0000 mg | ORAL_TABLET | Freq: Once | ORAL | Status: AC | PRN
  Administered 2016-01-13: 4 mg via ORAL
  Filled 2016-01-13: qty 1
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
## 2016-01-13 NOTE — Discharge Instructions (Signed)
I recommend following up with the primary care office listed above to schedule a follow up appointment in 1-2 weeks.  Please return to the Emergency Department if symptoms worsen or new onset of fever, headache, visual changes, lightheadedness, dizziness, chest pain, difficulty breathing, abdominal pain, vomiting, numbness, tingling, weakness, syncope, seizure.

## 2016-01-13 NOTE — ED Notes (Signed)
Patient hyperventilating upon entry into room, c/o SOB, dizziness, tingling, "fingers locking up".
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
## 2016-01-13 NOTE — ED Notes (Signed)
Pulse ox while ambulating 95%

## 2016-04-30 ENCOUNTER — Encounter (HOSPITAL_COMMUNITY): Payer: Self-pay

## 2016-04-30 ENCOUNTER — Emergency Department (HOSPITAL_COMMUNITY)
Admission: EM | Admit: 2016-04-30 | Discharge: 2016-04-30 | Disposition: A | Discharge status: Home / Self Care | Payer: Self-pay | Attending: Emergency Medicine | Admitting: Emergency Medicine

## 2016-04-30 DIAGNOSIS — H6121 Impacted cerumen, right ear: Secondary | ICD-10-CM

## 2016-04-30 DIAGNOSIS — H9201 Otalgia, right ear: Secondary | ICD-10-CM

## 2016-04-30 DIAGNOSIS — I10 Essential (primary) hypertension: Secondary | ICD-10-CM | POA: Insufficient documentation

## 2016-04-30 DIAGNOSIS — F17200 Nicotine dependence, unspecified, uncomplicated: Secondary | ICD-10-CM | POA: Insufficient documentation

## 2016-04-30 MED ORDER — CARBAMIDE PEROXIDE 6.5 % OT SOLN
5.0000 [drp] | Freq: Once | OTIC | Status: DC
  Filled 2016-04-30: qty 15

## 2016-04-30 NOTE — ED Notes (Signed)
Declined W/C at D/C and was escorted to lobby by RN. 

## 2016-04-30 NOTE — ED Triage Notes (Signed)
Patient here with bilateral ears stopped up x 2 days and complains of pain to right ear

## 2016-04-30 NOTE — Discharge Instructions (Signed)
Return here as needed. °

## 2016-04-30 NOTE — ED Provider Notes (Signed)
[facility]-EMERGENCY DEPT Provider Note   CSN: [id number] Arrival date & time: 04/30/16  1105  First Provider Contact:   First MD Initiated Contact with Patient 04/30/16 1140      By signing my name below, I, Vincent Mcpherson, attest that this documentation has been prepared under the direction and in the presence of [name], [employer]F CorporationPA-C Electronically Signed: Soijett Mcpherson, ED Scribe. 04/30/16. 11:44 AM.    History   Chief Complaint Chief Complaint  Patient presents with  . Otalgia    HPI  Vincent Mcpherson is a 35 y.o. male with a medical hx of HTN, A-fib, who presents to the Emergency Department complaining of constant right ear pain onset 4 days. Pt notes that it feels as if his bilateral ears are "stopped up."  He states that he is having associated symptoms of bilateral ears with "stopped up" sensation. He states that he has tried apple cider vinegar and rubbing alcohol without medications for the relief for his symptoms. He denies ear discharge and any other symptoms.   The history is provided by the patient. No language interpreter was used.    Past Medical History:  Diagnosis Date  . Atrial fibrillation (HCC)   . Hypertension   . Palpitations   . [facility]12   [facility]  . Varicose vein     Patient Active Problem List   Diagnosis Date Noted  . Atrial fibrillation (HCC) 09/03/2012  . Syncope 09/03/2012  . OSA (obstructive sleep apnea) 09/03/2012    History reviewed. No pertinent surgical history.     Home Medications    Prior to Admission medications   Medication Sig Start Date End Date Taking? Authorizing Provider  dextromethorphan-guaiFENesin (MUCINEX DM) 30-600 MG per 12 hr tablet Take 1 tablet by mouth 2 (two) times daily. Patient not taking: Reported on 01/13/2016 11/26/14   Vanetta MuldersScott Zackowski, MD  HYDROcodone-acetaminophen (NORCO/VICODIN) 5-325 MG per tablet Take 1 tablet by mouth every 4 (four) hours as needed. Patient not taking: Reported  on 01/13/2016 11/10/14   Garlon HatchetLisa M Sanders, PA-C  ibuprofen (ADVIL,MOTRIN) 800 MG tablet Take 1 tablet (800 mg total) by mouth every 8 (eight) hours as needed for mild pain or moderate pain. Patient not taking: Reported on 01/13/2016 11/16/13   Trixie DredgeEmily West, PA-C  naproxen (NAPROSYN) 500 MG tablet Take 1 tablet (500 mg total) by mouth 2 (two) times daily with a meal. Patient not taking: Reported on 01/13/2016 11/10/14   Garlon HatchetLisa M Sanders, PA-C  naproxen (NAPROSYN) 500 MG tablet Take 1 tablet (500 mg total) by mouth 2 (two) times daily. Patient not taking: Reported on 01/13/2016 11/26/14   Vanetta MuldersScott Zackowski, MD  naproxen sodium (ALEVE) 220 MG tablet Take 220 mg by mouth daily as needed (headache). PRN    Historical Provider, MD  sulfamethoxazole-trimethoprim (SEPTRA DS) 800-160 MG per tablet Take 1 tablet by mouth every 12 (twelve) hours. Patient not taking: Reported on 01/13/2016 04/15/14   Devoria AlbeIva Knapp, MD    Family History No family history on file.  Social History Social History  Substance Use Topics  . Smoking status: Current Every Day Smoker    Packs/day: 1.00  . Smokeless tobacco: Never Used  . Alcohol use Yes     Allergies   Penicillins   Review of Systems Review of Systems  A complete 10 system review of systems was obtained and all systems are negative except as noted in the HPI and PMH.   Physical Exam Updated Vital Signs  BP 132/84   Pulse 76   Temp 98.6 F (37 C)   Resp 18   SpO2 97%   Physical Exam  Constitutional: He is oriented to person, place, and time. He appears well-developed and well-nourished. No distress.  HENT:  Head: Normocephalic and atraumatic.  Left Ear: Tympanic membrane normal.  Right ear completely occluded with wax. Left TM nl.   Eyes: EOM are normal.  Neck: Neck supple.  Cardiovascular: Normal rate.   Pulmonary/Chest: Effort normal. No respiratory distress.  Abdominal: He exhibits no distension.  Musculoskeletal: Normal range of motion.  Neurological: He  is alert and oriented to person, place, and time.  Skin: Skin is warm and dry.  Psychiatric: He has a normal mood and affect. His behavior is normal.  Nursing note and vitals reviewed.    ED Treatments / Results  DIAGNOSTIC STUDIES: Oxygen Saturation is 97% on RA, nl by my interpretation.    COORDINATION OF CARE: 11:43 AM Discussed treatment plan with pt at bedside which includes cerumen removal and pt agreed to plan.  Procedures .Ear Cerumen Removal Date/Time: 04/30/2016 1:58 PM Performed by: Charlestine Night Authorized by: Charlestine Night   Consent:    Consent obtained:  Verbal   Consent given by:  Patient   Risks discussed:  Pain, dizziness, incomplete removal and TM perforation   Alternatives discussed:  No treatment, delayed treatment and alternative treatment Procedure details:    Location:  R ear   Procedure type: irrigation   Post-procedure details:    Inspection:  TM intact   Hearing quality:  Improved   Patient tolerance of procedure:  Tolerated well, no immediate complications   (including critical care time)  Medications Ordered in ED Medications - No data to display   Initial Impression / Assessment and Plan / ED Course  I have reviewed the triage vital signs and the nursing notes.   Clinical Course     Final Clinical Impressions(s) / ED Diagnoses   Final diagnoses:  None    New Prescriptions New Prescriptions   No medications on file   I personally performed the services described in this documentation, which was scribed in my presence. The recorded information has been reviewed and is accurate.     Charlestine Night, PA-C 04/30/16 1359    Benjiman Core, MD 04/30/16 [phone]

## 2016-12-24 ENCOUNTER — Encounter (HOSPITAL_COMMUNITY): Payer: Self-pay | Admitting: Emergency Medicine

## 2016-12-24 ENCOUNTER — Emergency Department (HOSPITAL_COMMUNITY): Payer: No Typology Code available for payment source

## 2016-12-24 ENCOUNTER — Emergency Department (HOSPITAL_COMMUNITY)
Admission: EM | Admit: 2016-12-24 | Discharge: 2016-12-24 | Disposition: A | Discharge status: Home / Self Care | Payer: No Typology Code available for payment source | Attending: Emergency Medicine | Admitting: Emergency Medicine

## 2016-12-24 DIAGNOSIS — I10 Essential (primary) hypertension: Secondary | ICD-10-CM | POA: Insufficient documentation

## 2016-12-24 DIAGNOSIS — Y999 Unspecified external cause status: Secondary | ICD-10-CM | POA: Diagnosis not present

## 2016-12-24 DIAGNOSIS — S39012A Strain of muscle, fascia and tendon of lower back, initial encounter: Secondary | ICD-10-CM | POA: Diagnosis not present

## 2016-12-24 DIAGNOSIS — Z79899 Other long term (current) drug therapy: Secondary | ICD-10-CM | POA: Diagnosis not present

## 2016-12-24 DIAGNOSIS — S39011A Strain of muscle, fascia and tendon of abdomen, initial encounter: Secondary | ICD-10-CM | POA: Diagnosis not present

## 2016-12-24 DIAGNOSIS — M549 Dorsalgia, unspecified: Secondary | ICD-10-CM

## 2016-12-24 DIAGNOSIS — F17200 Nicotine dependence, unspecified, uncomplicated: Secondary | ICD-10-CM | POA: Diagnosis not present

## 2016-12-24 DIAGNOSIS — Y92410 Unspecified street and highway as the place of occurrence of the external cause: Secondary | ICD-10-CM | POA: Diagnosis not present

## 2016-12-24 DIAGNOSIS — Y9389 Activity, other specified: Secondary | ICD-10-CM | POA: Diagnosis not present

## 2016-12-24 DIAGNOSIS — S3991XA Unspecified injury of abdomen, initial encounter: Secondary | ICD-10-CM | POA: Diagnosis present

## 2016-12-24 LAB — URINALYSIS, ROUTINE W REFLEX MICROSCOPIC
Bilirubin Urine: NEGATIVE
Glucose, UA: NEGATIVE mg/dL
Hgb urine dipstick: NEGATIVE
KETONES UR: NEGATIVE mg/dL
LEUKOCYTES UA: NEGATIVE
NITRITE: NEGATIVE
PROTEIN: NEGATIVE mg/dL
Specific Gravity, Urine: 1.024 (ref 1.005–1.030)
pH: 5 (ref 5.0–8.0)

## 2016-12-24 MED ORDER — CYCLOBENZAPRINE HCL 10 MG PO TABS: 10.0000 mg | ORAL_TABLET | Freq: Two times a day (BID) | ORAL | 0 refills | Status: DC | PRN

## 2016-12-24 MED ORDER — METHOCARBAMOL 500 MG PO TABS
1000.0000 mg | ORAL_TABLET | Freq: Once | ORAL | Status: AC
  Administered 2016-12-24: 1000 mg via ORAL
  Filled 2016-12-24: qty 2

## 2016-12-24 MED ORDER — KETOROLAC TROMETHAMINE 60 MG/2ML IM SOLN
60.0000 mg | Freq: Once | INTRAMUSCULAR | Status: AC
  Administered 2016-12-24: 60 mg via INTRAMUSCULAR
  Filled 2016-12-24: qty 2

## 2016-12-24 MED ORDER — IBUPROFEN 800 MG PO TABS: 800.0000 mg | ORAL_TABLET | Freq: Three times a day (TID) | ORAL | 0 refills | Status: DC
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
## 2016-12-24 NOTE — ED Notes (Signed)
Papers reviewed with patient along with medications. He verbalizes understanding and intent to follow up if needed. Leaving with his family today
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
## 2017-02-02 ENCOUNTER — Encounter (HOSPITAL_COMMUNITY): Payer: Self-pay

## 2017-02-02 ENCOUNTER — Emergency Department (HOSPITAL_COMMUNITY)
Admission: EM | Admit: 2017-02-02 | Discharge: 2017-02-02 | Disposition: A | Discharge status: Home / Self Care | Payer: Self-pay | Attending: Emergency Medicine | Admitting: Emergency Medicine

## 2017-02-02 ENCOUNTER — Emergency Department (HOSPITAL_COMMUNITY): Payer: Self-pay

## 2017-02-02 DIAGNOSIS — Z23 Encounter for immunization: Secondary | ICD-10-CM | POA: Insufficient documentation

## 2017-02-02 DIAGNOSIS — F17200 Nicotine dependence, unspecified, uncomplicated: Secondary | ICD-10-CM | POA: Insufficient documentation

## 2017-02-02 DIAGNOSIS — I10 Essential (primary) hypertension: Secondary | ICD-10-CM | POA: Insufficient documentation

## 2017-02-02 DIAGNOSIS — Y929 Unspecified place or not applicable: Secondary | ICD-10-CM | POA: Insufficient documentation

## 2017-02-02 DIAGNOSIS — Y939 Activity, unspecified: Secondary | ICD-10-CM | POA: Insufficient documentation

## 2017-02-02 DIAGNOSIS — Y999 Unspecified external cause status: Secondary | ICD-10-CM | POA: Insufficient documentation

## 2017-02-02 DIAGNOSIS — W260XXA Contact with knife, initial encounter: Secondary | ICD-10-CM | POA: Insufficient documentation

## 2017-02-02 DIAGNOSIS — Z79899 Other long term (current) drug therapy: Secondary | ICD-10-CM | POA: Insufficient documentation

## 2017-02-02 DIAGNOSIS — S61210A Laceration without foreign body of right index finger without damage to nail, initial encounter: Secondary | ICD-10-CM | POA: Insufficient documentation

## 2017-02-02 MED ORDER — BACITRACIN ZINC 500 UNIT/GM EX OINT: 1.0000 "application " | TOPICAL_OINTMENT | Freq: Two times a day (BID) | CUTANEOUS | 1 refills | Status: DC

## 2017-02-02 MED ORDER — LIDOCAINE HCL (PF) 1 % IJ SOLN
10.0000 mL | Freq: Once | INTRAMUSCULAR | Status: AC
  Administered 2017-02-02: 10 mL
  Filled 2017-02-02: qty 10

## 2017-02-02 MED ORDER — BACITRACIN ZINC 500 UNIT/GM EX OINT
1.0000 "application " | TOPICAL_OINTMENT | Freq: Two times a day (BID) | CUTANEOUS | Status: DC
  Administered 2017-02-02: 1 via TOPICAL

## 2017-02-02 MED ORDER — TETANUS-DIPHTH-ACELL PERTUSSIS 5-2.5-18.5 LF-MCG/0.5 IM SUSP
0.5000 mL | Freq: Once | INTRAMUSCULAR | Status: AC
  Administered 2017-02-02: 0.5 mL via INTRAMUSCULAR
  Filled 2017-02-02: qty 0.5

## 2017-02-02 NOTE — ED Triage Notes (Signed)
Pt presents with laceration to R 2nd finger while cutting tomatoes today just PTA.  Wound uncovered in triage, noted large amount of blood on kitchen towel, with pulsing blood up x 3 inches.  Pressure applied.

## 2017-02-02 NOTE — ED Provider Notes (Signed)
[facility]-EM[facility]ENCY DEPT Provider Note   CSN: [id number] Arrival date & time: 02/02/17  1233     History   Chief Complaint No chief complaint on file.   HPI Vincent Mcpherson is a 36 y.o. male.  Vincent Mcpherson is a 36 y.o. Male who is left hand dominant who presents to the ED complaining of a right index finger laceration sustained prior to arrival. Patient reports he was cutting a tomato when the knife slipped and cut his right index finger. He denies other injury. He denies weakness. He is unsure when his last Tdap was. He denies fevers, numbness, tingling, weakness, or other injury.    The history is provided by the patient and medical records. No language interpreter was used.    Past Medical History:  Diagnosis Date  . Atrial fibrillation (HCC)   . Hypertension   . Palpitations   . Syncope 05/04/12   while visiting another pt at [facility]  . Varicose vein     Patient Active Problem List   Diagnosis Date Noted  . Atrial fibrillation (HCC) 09/03/2012  . Syncope 09/03/2012  . OSA (obstructive sleep apnea) 09/03/2012    History reviewed. No pertinent surgical history.     Home Medications    Prior to Admission medications   Medication Sig Start Date End Date Taking? Authorizing Provider  bacitracin ointment Apply 1 application topically 2 (two) times daily. 02/02/17   Everlene Farrier, PA-C  cyclobenzaprine (FLEXERIL) 10 MG tablet Take 1 tablet (10 mg total) by mouth 2 (two) times daily as needed for muscle spasms. 12/24/16   Arby Barrette, MD  dextromethorphan-guaiFENesin [facility] DM) 30-600 MG per 12 hr tablet Take 1 tablet by mouth 2 (two) times daily. Patient not taking: Reported on 01/13/2016 11/26/14   Vanetta Mulders, MD  HYDROcodone-acetaminophen (NORCO/VICODIN) 5-325 MG per tablet Take 1 tablet by mouth every 4 (four) hours as needed. Patient not taking: Reported on 01/13/2016 11/10/14   Garlon Hatchet, PA-C  ibuprofen (ADVIL,MOTRIN) 800 MG tablet Take 1 tablet (800 mg  total) by mouth every 8 (eight) hours as needed for mild pain or moderate pain. Patient not taking: Reported on 01/13/2016 11/16/13   Trixie Dredge, PA-C  ibuprofen (ADVIL,MOTRIN) 800 MG tablet Take 1 tablet (800 mg total) by mouth 3 (three) times daily. 12/24/16   Arby Barrette, MD  naproxen (NAPROSYN) 500 MG tablet Take 1 tablet (500 mg total) by mouth 2 (two) times daily with a meal. Patient not taking: Reported on 01/13/2016 11/10/14   Garlon Hatchet, PA-C  naproxen (NAPROSYN) 500 MG tablet Take 1 tablet (500 mg total) by mouth 2 (two) times daily. Patient not taking: Reported on 01/13/2016 11/26/14   Vanetta Mulders, MD  naproxen sodium (ALEVE) 220 MG tablet Take 440 mg by mouth 2 (two) times daily as needed (headache).     [provider]  sulfamethoxazole-trimethoprim (SEPTRA DS) 800-160 MG per tablet Take 1 tablet by mouth every 12 (twelve) hours. Patient not taking: Reported on 01/13/2016 04/15/14   Devoria Albe, MD    Family History History reviewed. No pertinent family history.  Social History Social History  Substance Use Topics  . Smoking status: Current Every Day Smoker    Packs/day: 1.00  . Smokeless tobacco: Never Used  . Alcohol use Yes     Allergies   Penicillins   Review of Systems Review of Systems  Constitutional: Negative for chills and fever.  Musculoskeletal: Positive for arthralgias.  Skin: Positive for wound. Negative for  color change.  Neurological: Negative for weakness and numbness.     Physical Exam Updated Vital Signs BP 120/82   Pulse 80   Temp 98.6 F (37 C) (Oral)   Resp 16   Ht 5\' 9"  (1.753 m)   Wt 122.5 kg   SpO2 95%   BMI 39.87 kg/m   Physical Exam  Constitutional: He appears well-developed and well-nourished. No distress.  HENT:  Head: Normocephalic and atraumatic.  Eyes: Right eye exhibits no discharge. Left eye exhibits no discharge.  Cardiovascular: Normal rate, regular rhythm and intact distal pulses.   Bilateral radial  pulses are intact. Good capillary refill to his right fingertips.   Pulmonary/Chest: Effort normal. No respiratory distress.  Musculoskeletal: Normal range of motion. He exhibits tenderness. He exhibits no edema or deformity.  3 cm laceration to the right index finger on the palmar aspect between the MCP and PIP joint. Good ROM of his right index finger with flexion and extension. Good strength. No evidence of tendon disruption.   Neurological: He is alert. No sensory deficit. Coordination normal.  Sensation intact to his bilateral fingertips.   Skin: Skin is warm and dry. Capillary refill takes less than 2 seconds. No rash noted. He is not diaphoretic. No erythema. No pallor.  See musculoskeletal.   Psychiatric: He has a normal mood and affect. His behavior is normal.  Nursing note and vitals reviewed.    ED Treatments / Results  Labs (all labs ordered are listed, but only abnormal results are displayed) Labs Reviewed - No data to display  EKG  EKG Interpretation None       Radiology Dg Hand Complete Right  Result Date: 02/02/2017 CLINICAL DATA:  Laceration to right index finger EXAM: RIGHT HAND - COMPLETE 3+ VIEW COMPARISON:  None. FINDINGS: There is no evidence of fracture or dislocation. There is no evidence of arthropathy or other focal bone abnormality. No radiopaque foreign body. IMPRESSION: Negative. Electronically Signed   By: Jasmine Pang M.D.   On: 02/02/2017 13:55    Procedures .Marland KitchenLaceration Repair Date/Time: 02/02/2017 3:51 PM Performed by: Everlene Farrier Authorized by: Everlene Farrier   Consent:    Consent obtained:  Verbal   Consent given by:  Patient   Risks discussed:  Infection, need for additional repair, pain, poor cosmetic result, retained foreign body, tendon damage, vascular damage, poor wound healing and nerve damage Anesthesia (see MAR for exact dosages):    Anesthesia method:  Nerve block   Block location:  Right index finger   Block needle gauge:   24 G   Block anesthetic:  Lidocaine 1% w/o epi   Block technique:  Digital block.    Block injection procedure:  Anatomic landmarks identified, introduced needle, incremental injection, negative aspiration for blood and anatomic landmarks palpated   Block outcome:  Anesthesia achieved Laceration details:    Location:  Finger   Finger location:  R index finger   Length (cm):  3   Depth (mm):  4 Repair type:    Repair type:  Intermediate Pre-procedure details:    Preparation:  Patient was prepped and draped in usual sterile fashion and imaging obtained to evaluate for foreign bodies Exploration:    Hemostasis achieved with:  Tourniquet and direct pressure   Wound exploration: wound explored through full range of motion and entire depth of wound probed and visualized     Wound extent: no foreign bodies/material noted, no tendon damage noted and no underlying fracture noted  Contaminated: no   Treatment:    Area cleansed with:  Saline   Amount of cleaning:  Standard   Irrigation solution:  Sterile saline Skin repair:    Repair method:  Sutures   Suture size:  4-0   Suture material:  Prolene   Suture technique:  Simple interrupted   Number of sutures:  7 Approximation:    Approximation:  Close Post-procedure details:    Dressing:  Antibiotic ointment, non-adherent dressing, sterile dressing and splint for protection   Patient tolerance of procedure:  Tolerated well, no immediate complications    (including critical care time)  Medications Ordered in ED Medications  bacitracin ointment 1 application (1 application Topical Given 02/02/17 1550)  lidocaine (PF) (XYLOCAINE) 1 % injection 10 mL (10 mLs Infiltration Given 02/02/17 1358)  Tdap (BOOSTRIX) injection 0.5 mL (0.5 mLs Intramuscular Given 02/02/17 1549)     Initial Impression / Assessment and Plan / ED Course  I have reviewed the triage vital signs and the nursing notes.  Pertinent labs & imaging results that were  available during my care of the patient were reviewed by me and considered in my medical decision making (see chart for details).    This is a 36 y.o. Male who is left hand dominant who presents to the ED complaining of a right index finger laceration sustained prior to arrival. Patient reports he was cutting a tomato when the knife slipped and cut his right index finger. He denies other injury. He denies weakness. He is unsure when his last Tdap was. On exam patient is afebrile nontoxic appearing. Is a 3 cm laceration noted to the palmar aspect of his right index finger between his MCP and PIP joint. No evidence of tendon involvement. He has good strength with flexion and extension of his right finger. X-ray shows no fracture or foreign body. Laceration repair by myself and tolerated well by the patient. Evaluation following repair of the laceration he does appear to have maybe some slight weakness with flexion of his right index finger, likely due to the laceration. I have low suspicion for tendon involvement. I did provide the patient with information for follow-up with hand surgery to use as needed. I discussed wound care instructions. Splint for protection. Stitches out in about 7 days. I advised the patient to follow-up with their primary care provider this week. I advised the patient to return to the emergency department with new or worsening symptoms or new concerns. The patient verbalized understanding and agreement with plan.      Final Clinical Impressions(s) / ED Diagnoses   Final diagnoses:  Laceration of right index finger without foreign body without damage to nail, initial encounter  Need for diphtheria-tetanus-pertussis (Tdap) vaccine    New Prescriptions Discharge Medication List as of 02/02/2017  3:49 PM    START taking these medications   Details  bacitracin ointment Apply 1 application topically 2 (two) times daily., Starting Thu 02/02/2017, Print         Everlene FarrierDansie, Kelani Robart,  PA-C 02/02/17 1557    Little, Ambrose Finlandachel Morgan, MD 02/02/17 1620

## 2017-02-11 ENCOUNTER — Ambulatory Visit (HOSPITAL_COMMUNITY)
Admission: EM | Admit: 2017-02-11 | Discharge: 2017-02-11 | Disposition: A | Discharge status: Home / Self Care | Payer: Self-pay | Attending: Family Medicine | Admitting: Family Medicine

## 2017-02-11 ENCOUNTER — Encounter (HOSPITAL_COMMUNITY): Payer: Self-pay | Admitting: *Deleted

## 2017-02-11 DIAGNOSIS — R202 Paresthesia of skin: Secondary | ICD-10-CM

## 2017-02-11 DIAGNOSIS — Z4802 Encounter for removal of sutures: Secondary | ICD-10-CM

## 2017-02-11 NOTE — ED Triage Notes (Signed)
Pt    Reports      Numbness  To  The  r   Pointer  Finger       And    Some   Swelling  Pt  Was     Seen  In    Er   1  Week  Ago     And   Was  Told  To   followup  With  A   Specialist      He  Has  Not   Made  An  appt  Yet    He  Had   A  Tet  Shot  In the  Er

## 2017-02-11 NOTE — ED Provider Notes (Signed)
CSN: [id number]     Arrival date & time 02/11/17  1537 History   First MD Initiated Contact with Patient 02/11/17 1612     Chief Complaint  Patient presents with  . Wound Check   (Consider location/radiation/quality/duration/timing/severity/associated sxs/prior Treatment) Patient is a 36 y.o. Male, here for suture removal. He sustained a 3 cm laceration to his right 2nd digit finger 9 days ago and laceration was repaired in the ER on the day of injury with 7 sutures. Xray in the ER was negative. Tetanus was given in the ER. Patient denies pain. He denies fever. He is concern for the finger feeling a bit numb.         Past Medical History:  Diagnosis Date  . Atrial fibrillation (HCC)   . Hypertension   . Palpitations   . [facility]yncope 05/04/12   [facility]  . Varicose vein    History reviewed. No pertinent surgical history. No family history on file. Social History  Substance Use Topics  . Smoking status: Current Every Day Smoker    Packs/day: 1.00  . Smokeless tobacco: Never Used  . Alcohol use Yes    Review of Systems  Constitutional:       As stated in the HPI    Allergies  Penicillins  Home Medications   Prior to Admission medications   Medication Sig Start Date End Date Taking? Authorizing Provider  bacitracin ointment Apply 1 application topically 2 (two) times daily. 02/02/17   Everlene Farrieransie, William, PA-C  cyclobenzaprine (FLEXERIL) 10 MG tablet Take 1 tablet (10 mg total) by mouth 2 (two) times daily as needed for muscle spasms. 12/24/16   Arby BarrettePfeiffer, Marcy, MD  dextromethorphan-guaiFENesin [facility](MUCINEX DM) 30-600 MG per 12 hr tablet Take 1 tablet by mouth 2 (two) times daily. Patient not taking: Reported on 01/13/2016 11/26/14   Vanetta MuldersZackowski, Scott, MD  HYDROcodone-acetaminophen (NORCO/VICODIN) 5-325 MG per tablet Take 1 tablet by mouth every 4 (four) hours as needed. Patient not taking: Reported on 01/13/2016 11/10/14   Garlon HatchetSanders, Lisa M, PA-C  ibuprofen  (ADVIL,MOTRIN) 800 MG tablet Take 1 tablet (800 mg total) by mouth every 8 (eight) hours as needed for mild pain or moderate pain. Patient not taking: Reported on 01/13/2016 11/16/13   Trixie DredgeWest, Emily, PA-C  ibuprofen (ADVIL,MOTRIN) 800 MG tablet Take 1 tablet (800 mg total) by mouth 3 (three) times daily. 12/24/16   Arby BarrettePfeiffer, Marcy, MD  naproxen (NAPROSYN) 500 MG tablet Take 1 tablet (500 mg total) by mouth 2 (two) times daily with a meal. Patient not taking: Reported on 01/13/2016 11/10/14   Garlon HatchetSanders, Lisa M, PA-C  naproxen (NAPROSYN) 500 MG tablet Take 1 tablet (500 mg total) by mouth 2 (two) times daily. Patient not taking: Reported on 01/13/2016 11/26/14   Vanetta MuldersZackowski, Scott, MD  naproxen sodium (ALEVE) 220 MG tablet Take 440 mg by mouth 2 (two) times daily as needed (headache).     [provider]  sulfamethoxazole-trimethoprim (SEPTRA DS) 800-160 MG per tablet Take 1 tablet by mouth every 12 (twelve) hours. Patient not taking: Reported on 01/13/2016 04/15/14   Devoria AlbeKnapp, Iva, MD   Meds Ordered and Administered this Visit  Medications - No data to display  BP (!) 144/70 (BP Location: Right Arm)   Pulse 78   Temp 98.6 F (37 C)   Resp 18   SpO2 100%  No data found.   Physical Exam  Constitutional: He is oriented to person, place, and time. He appears well-developed and well-nourished.  Cardiovascular: Normal rate.   Pulmonary/Chest: Effort normal.  Neurological: He is alert and oriented to person, place, and time.  Skin:  Has a linear healed 3 cm laceration to Right 2nd digit finger. Healed well. 7 sutures intact  Nursing note and vitals reviewed.   Urgent Care Course     Procedures (including critical care time)  Labs Review Labs Reviewed - No data to display  Imaging Review No results found.  MDM   1. Visit for suture removal    1) Wound appears well. Sutures removed.  2) Patient educated that numbness at the area is not uncommon and will improve 3) Return as needed. F/u  with PCP as scheduled    Lucia Estelle, NP 02/11/17 1629

## 2017-03-20 ENCOUNTER — Encounter (HOSPITAL_COMMUNITY): Payer: Self-pay | Admitting: Emergency Medicine

## 2017-03-20 ENCOUNTER — Ambulatory Visit (HOSPITAL_COMMUNITY)
Admission: EM | Admit: 2017-03-20 | Discharge: 2017-03-20 | Disposition: A | Discharge status: Home / Self Care | Payer: Self-pay | Attending: Family Medicine | Admitting: Family Medicine

## 2017-03-20 DIAGNOSIS — F419 Anxiety disorder, unspecified: Secondary | ICD-10-CM

## 2017-03-20 DIAGNOSIS — R05 Cough: Secondary | ICD-10-CM

## 2017-03-20 DIAGNOSIS — F411 Generalized anxiety disorder: Secondary | ICD-10-CM

## 2017-03-20 DIAGNOSIS — R059 Cough, unspecified: Secondary | ICD-10-CM

## 2017-03-20 DIAGNOSIS — J209 Acute bronchitis, unspecified: Secondary | ICD-10-CM

## 2017-03-20 DIAGNOSIS — J40 Bronchitis, not specified as acute or chronic: Secondary | ICD-10-CM

## 2017-03-20 DIAGNOSIS — R062 Wheezing: Secondary | ICD-10-CM

## 2017-03-20 MED ORDER — HYDROXYZINE HCL 25 MG PO TABS: ORAL_TABLET | ORAL | 0 refills | Status: DC

## 2017-03-20 MED ORDER — ALBUTEROL SULFATE (2.5 MG/3ML) 0.083% IN NEBU
2.5000 mg | INHALATION_SOLUTION | Freq: Once | RESPIRATORY_TRACT | Status: AC
  Administered 2017-03-20: 2.5 mg via RESPIRATORY_TRACT

## 2017-03-20 MED ORDER — ALBUTEROL SULFATE (2.5 MG/3ML) 0.083% IN NEBU
INHALATION_SOLUTION | RESPIRATORY_TRACT | Status: AC
  Filled 2017-03-20: qty 3

## 2017-03-20 MED ORDER — ALBUTEROL SULFATE HFA 108 (90 BASE) MCG/ACT IN AERS
1.0000 | INHALATION_SPRAY | Freq: Four times a day (QID) | RESPIRATORY_TRACT | 0 refills | Status: DC | PRN
Start: 2017-03-20 — End: 2018-02-20

## 2017-03-20 NOTE — ED Triage Notes (Signed)
[facility] a [facility] of [facility] cough with SOB and dizziness x 2 days.

## 2017-03-20 NOTE — ED Provider Notes (Signed)
CSN: [id number]     Arrival date & time 03/20/17  1718 History   First MD Initiated Contact with Patient 03/20/17 1758     Chief Complaint  Patient presents with  . Cough   (Consider location/radiation/quality/duration/timing/severity/associated sxs/prior Treatment) Patient c/o cough and wheezing today.  He states he gets panic attacks.   The history is provided by the patient.  Cough  Cough characteristics:  Non-productive Sputum characteristics:  White Severity:  Mild Onset quality:  Sudden Duration:  2 days Timing:  Constant Progression:  Worsening Chronicity:  New Smoker: no   Relieved by:  Nothing Worsened by:  Nothing Ineffective treatments:  None tried   Past Medical History:  Diagnosis Date  . Atrial fibrillation (HCC)   . Hypertension   . Palpitations   . Syncope 05/04/12   while visiting another pt at [facility]  . Varicose vein    History reviewed. No pertinent surgical history. History reviewed. No pertinent family history. Social History  Substance Use Topics  . Smoking status: Current Every Day Smoker    Packs/day: 1.00  . Smokeless tobacco: Never Used  . Alcohol use Yes    Review of Systems  Constitutional: Negative.   HENT: Negative.   Eyes: Negative.   Respiratory: Positive for cough.   Cardiovascular: Negative.   Gastrointestinal: Negative.   Endocrine: Negative.   Genitourinary: Negative.   Musculoskeletal: Negative.   Allergic/Immunologic: Negative.   Neurological: Positive for dizziness.  Hematological: Negative.   Psychiatric/Behavioral: Negative.     Allergies  Penicillins  Home Medications   Prior to Admission medications   Medication Sig Start Date End Date Taking? Authorizing Provider  albuterol (PROVENTIL HFA;VENTOLIN HFA) 108 (90 Base) MCG/ACT inhaler Inhale 1-2 puffs into the lungs every 6 (six) hours as needed for wheezing or shortness of breath. 03/20/17   Deatra Canter, FNP  hydrOXYzine (ATARAX/VISTARIL) 25 MG tablet Take  one po q 6 hours prn anxiety 03/20/17   Deatra Canter, FNP   Meds Ordered and Administered this Visit   Medications  albuterol (PROVENTIL) (2.5 MG/3ML) 0.083% nebulizer solution 2.5 mg (2.5 mg Nebulization Given 03/20/17 1810)    BP (!) 144/84 (BP Location: Right Arm)   Pulse 89   Temp 98.4 F (36.9 C) (Oral)   Resp 18   SpO2 100%  No data found.   Physical Exam  Constitutional: He is oriented to person, place, and time. He appears well-developed and well-nourished.  HENT:  Head: Normocephalic and atraumatic.  Eyes: Conjunctivae and EOM are normal. Pupils are equal, round, and reactive to light.  Neck: Normal range of motion. Neck supple.  Cardiovascular: Normal rate, regular rhythm and normal heart sounds.   Pulmonary/Chest: Effort normal and breath sounds normal.  Abdominal: Soft. Bowel sounds are normal.  Neurological: He is alert and oriented to person, place, and time.  Psychiatric: He has a normal mood and affect.  Nursing note and vitals reviewed.   Urgent Care Course     Procedures (including critical care time)  Labs Review Labs Reviewed - No data to display  Imaging Review No results found.   Visual Acuity Review  Right Eye Distance:   Left Eye Distance:   Bilateral Distance:    Right Eye Near:   Left Eye Near:    Bilateral Near:         MDM   1. Cough   2. Bronchitis   3. Anxiety state    Albuterol MDI - 1-2 puffs q 6 hours #  1 Hydroxyzine 25mg  one po q 6 hours prn #24  Work note      Deatra CanterOxford, William J, OregonFNP 03/20/17 1839

## 2017-08-09 ENCOUNTER — Encounter (HOSPITAL_COMMUNITY): Payer: Self-pay | Admitting: Emergency Medicine

## 2017-08-09 ENCOUNTER — Other Ambulatory Visit: Payer: Self-pay

## 2017-08-09 ENCOUNTER — Ambulatory Visit (HOSPITAL_COMMUNITY)
Admission: EM | Admit: 2017-08-09 | Discharge: 2017-08-09 | Disposition: A | Discharge status: Home / Self Care | Payer: Self-pay | Attending: Family Medicine | Admitting: Family Medicine

## 2017-08-09 DIAGNOSIS — K0889 Other specified disorders of teeth and supporting structures: Secondary | ICD-10-CM

## 2017-08-09 MED ORDER — HYDROCODONE-ACETAMINOPHEN 5-325 MG PO TABS: 1.0000 | ORAL_TABLET | Freq: Four times a day (QID) | ORAL | 0 refills | Status: DC | PRN

## 2017-08-09 MED ORDER — CLINDAMYCIN HCL 300 MG PO CAPS: ORAL_CAPSULE | ORAL | 0 refills | Status: DC

## 2017-08-09 NOTE — ED Triage Notes (Signed)
Pt c/o tooth pain on R lower jaw, pt also states he woke up and there was a knot on his jaw.

## 2017-08-15 NOTE — ED Provider Notes (Signed)
  [facility]   [id number] 08/09/17 Arrival Time: 1634  ASSESSMENT & PLAN:  1. Pain, dental   2. Toothache     Meds ordered this encounter  Medications  . HYDROcodone-acetaminophen (NORCO/VICODIN) 5-325 MG tablet    Sig: Take 1 tablet by mouth every 6 (six) hours as needed for moderate pain or severe pain.    Dispense:  10 tablet    Refill:  0  . clindamycin (CLEOCIN) 300 MG capsule    Sig: Take one capsule by mouth three times daily for one week.    Dispense:  21 capsule    Refill:  0    [place] Controlled Substances Registry consulted for this patient. I feel the risk/benefit ratio today is favorable for proceeding with this prescription for a controlled substance. Medication sedation precautions given.  Dental resource written instructions given. He will schedule dental evaluation as soon as possible.  Reviewed expectations re: course of current medical issues. Questions answered. Outlined signs and symptoms indicating need for more acute intervention. Patient verbalized understanding. After Visit Summary given.   SUBJECTIVE:  Vincent Mcpherson is a 36 y.o. male who reports gradual onset of right lower dental pain. Present for a few days. Does not see a dentist regularly. Afebrile. Tolerating PO intake but painful if chewing on R side. No drainage from gums. OTC analgesics without much relief.  ROS: As per HPI.  OBJECTIVE:  Vitals:   08/09/17 1656  BP: 133/79  Pulse: 84  Resp: 14  Temp: 98.2 F (36.8 C)  SpO2: 97%    General appearance: alert; no distress HENT: normocephalic; atraumatic; dentition: fair; gingival hypertrophy over R lower gum; no fluctuance but tender to palpation Neck: supple without LAD Lungs: normal respirations Skin: warm and dry Psychological: alert and cooperative; normal mood and affect   Allergies  Allergen Reactions  . Penicillins Rash    Past Medical History:  Diagnosis Date  . Atrial fibrillation (HCC)   . Hypertension    . Palpitations   . [facility]ync[facility] 05/04/12   [facility]  . Varicose vein    Social History   Socioeconomic History  . Marital status: Single    Spouse name: Not on file  . Number of children: Not on file  . Years of education: Not on file  . Highest education level: Not on file  Social Needs  . Financial resource strain: Not on file  . Food insecurity - worry: Not on file  . Food insecurity - inability: Not on file  . Transportation needs - medical: Not on file  . Transportation needs - non-medical: Not on file  Occupational History  . Not on file  Tobacco Use  . Smoking status: Current Every Day Smoker    Packs/day: 1.00  . Smokeless tobacco: Never Used  Substance and Sexual Activity  . Alcohol use: Yes  . Drug use: Yes    Types: Marijuana  . Sexual activity: Not on file  Other Topics Concern  . Not on file  Social History Narrative  . Not on file      Mardella LaymanHagler, Natan Hartog, MD 08/15/17 [phone]

## 2017-09-01 ENCOUNTER — Other Ambulatory Visit: Payer: Self-pay

## 2017-09-01 ENCOUNTER — Observation Stay (HOSPITAL_COMMUNITY)
Admission: EM | Admit: 2017-09-01 | Discharge: 2017-09-02 | Disposition: A | Discharge status: Home / Self Care | Payer: Self-pay | Attending: Cardiology | Admitting: Cardiology

## 2017-09-01 ENCOUNTER — Encounter (HOSPITAL_COMMUNITY): Payer: Self-pay | Admitting: Emergency Medicine

## 2017-09-01 DIAGNOSIS — Z880 Allergy status to penicillin: Secondary | ICD-10-CM | POA: Insufficient documentation

## 2017-09-01 DIAGNOSIS — F1410 Cocaine abuse, uncomplicated: Secondary | ICD-10-CM | POA: Insufficient documentation

## 2017-09-01 DIAGNOSIS — Z79899 Other long term (current) drug therapy: Secondary | ICD-10-CM | POA: Insufficient documentation

## 2017-09-01 DIAGNOSIS — I4891 Unspecified atrial fibrillation: Secondary | ICD-10-CM

## 2017-09-01 DIAGNOSIS — F1210 Cannabis abuse, uncomplicated: Secondary | ICD-10-CM | POA: Insufficient documentation

## 2017-09-01 DIAGNOSIS — I10 Essential (primary) hypertension: Secondary | ICD-10-CM | POA: Insufficient documentation

## 2017-09-01 DIAGNOSIS — Z7901 Long term (current) use of anticoagulants: Secondary | ICD-10-CM | POA: Insufficient documentation

## 2017-09-01 DIAGNOSIS — I480 Paroxysmal atrial fibrillation: Principal | ICD-10-CM | POA: Insufficient documentation

## 2017-09-01 DIAGNOSIS — Z8249 Family history of ischemic heart disease and other diseases of the circulatory system: Secondary | ICD-10-CM | POA: Insufficient documentation

## 2017-09-01 DIAGNOSIS — R55 Syncope and collapse: Secondary | ICD-10-CM | POA: Insufficient documentation

## 2017-09-01 DIAGNOSIS — R002 Palpitations: Secondary | ICD-10-CM | POA: Insufficient documentation

## 2017-09-01 DIAGNOSIS — F17200 Nicotine dependence, unspecified, uncomplicated: Secondary | ICD-10-CM | POA: Insufficient documentation

## 2017-09-01 DIAGNOSIS — G4733 Obstructive sleep apnea (adult) (pediatric): Secondary | ICD-10-CM | POA: Insufficient documentation

## 2017-09-01 LAB — CBC WITH DIFFERENTIAL/PLATELET
BASOS ABS: 0 10*3/uL (ref 0.0–0.1)
Basophils Relative: 0 %
Eosinophils Absolute: 0.1 10*3/uL (ref 0.0–0.7)
Eosinophils Relative: 1 %
HEMATOCRIT: 42.4 % (ref 39.0–52.0)
Hemoglobin: 13.8 g/dL (ref 13.0–17.0)
LYMPHS ABS: 3 10*3/uL (ref 0.7–4.0)
LYMPHS PCT: 27 %
MCH: 30.7 pg (ref 26.0–34.0)
MCHC: 32.5 g/dL (ref 30.0–36.0)
MCV: 94.4 fL (ref 78.0–100.0)
MONO ABS: 0.7 10*3/uL (ref 0.1–1.0)
Monocytes Relative: 6 %
NEUTROS ABS: 7.5 10*3/uL (ref 1.7–7.7)
Neutrophils Relative %: 66 %
Platelets: 320 10*3/uL (ref 150–400)
RBC: 4.49 MIL/uL (ref 4.22–5.81)
RDW: 13.3 % (ref 11.5–15.5)
WBC: 11.3 10*3/uL — ABNORMAL HIGH (ref 4.0–10.5)

## 2017-09-01 LAB — TSH
TSH: 1.162 u[IU]/mL (ref 0.350–4.500)
TSH: 1.441 u[IU]/mL (ref 0.350–4.500)

## 2017-09-01 LAB — COMPREHENSIVE METABOLIC PANEL
ALBUMIN: 3.6 g/dL (ref 3.5–5.0)
ALT: 19 U/L (ref 17–63)
ANION GAP: 8 (ref 5–15)
AST: 30 U/L (ref 15–41)
Alkaline Phosphatase: 48 U/L (ref 38–126)
BILIRUBIN TOTAL: 1.4 mg/dL — AB (ref 0.3–1.2)
BUN: 12 mg/dL (ref 6–20)
CHLORIDE: 104 mmol/L (ref 101–111)
CO2: 25 mmol/L (ref 22–32)
Calcium: 9 mg/dL (ref 8.9–10.3)
Creatinine, Ser: 1.16 mg/dL (ref 0.61–1.24)
GFR calc Af Amer: >60 mL/min (ref >60)
GFR calc non Af Amer: >60 mL/min (ref >60)
GLUCOSE: 94 mg/dL (ref 65–99)
POTASSIUM: 3.7 mmol/L (ref 3.5–5.1)
SODIUM: 137 mmol/L (ref 135–145)
TOTAL PROTEIN: 6.5 g/dL (ref 6.5–8.1)

## 2017-09-01 LAB — TROPONIN I: Troponin I: <0.03 ng/mL (ref <0.03)

## 2017-09-01 MED ORDER — HYDROCODONE-ACETAMINOPHEN 5-325 MG PO TABS
1.0000 | ORAL_TABLET | Freq: Four times a day (QID) | ORAL | Status: DC | PRN
  Filled 2017-09-01: qty 1

## 2017-09-01 MED ORDER — SODIUM CHLORIDE 0.9 % IV BOLUS (SEPSIS)
1000.0000 mL | Freq: Once | INTRAVENOUS | Status: AC
  Administered 2017-09-01: 1000 mL via INTRAVENOUS

## 2017-09-01 MED ORDER — DIAZEPAM 5 MG PO TABS
5.0000 mg | ORAL_TABLET | Freq: Once | ORAL | Status: AC
  Administered 2017-09-01: 5 mg via ORAL
  Filled 2017-09-01: qty 1

## 2017-09-01 MED ORDER — VERAPAMIL HCL 2.5 MG/ML IV SOLN
10.0000 mg | Freq: Once | INTRAVENOUS | Status: AC
  Administered 2017-09-01: 10 mg via INTRAVENOUS
  Filled 2017-09-01: qty 4

## 2017-09-01 MED ORDER — RIVAROXABAN 20 MG PO TABS: 20.0000 mg | ORAL_TABLET | Freq: Every day | ORAL | Status: DC

## 2017-09-01 MED ORDER — DEXTROSE 5 % IV SOLN
5.0000 mg/h | INTRAVENOUS | Status: DC
  Administered 2017-09-01: 5 mg/h via INTRAVENOUS
  Administered 2017-09-02 (×3): 12.5 mg/h via INTRAVENOUS
  Filled 2017-09-01 (×4): qty 100

## 2017-09-01 MED ORDER — DILTIAZEM LOAD VIA INFUSION
10.0000 mg | Freq: Once | INTRAVENOUS | Status: AC
  Administered 2017-09-01: 10 mg via INTRAVENOUS
  Filled 2017-09-01: qty 10

## 2017-09-01 MED ORDER — ACETAMINOPHEN 325 MG PO TABS: 650.0000 mg | ORAL_TABLET | Freq: Four times a day (QID) | ORAL | Status: DC | PRN

## 2017-09-01 MED ORDER — RIVAROXABAN 20 MG PO TABS
20.0000 mg | ORAL_TABLET | Freq: Every day | ORAL | Status: DC
  Administered 2017-09-01: 20 mg via ORAL
  Filled 2017-09-01: qty 1

## 2017-09-01 MED ORDER — ONDANSETRON HCL 4 MG/2ML IJ SOLN: 4.0000 mg | Freq: Four times a day (QID) | INTRAMUSCULAR | Status: DC | PRN

## 2017-09-01 MED ORDER — ACETAMINOPHEN 325 MG PO TABS: 650.0000 mg | ORAL_TABLET | ORAL | Status: DC | PRN

## 2017-09-01 NOTE — Progress Notes (Signed)
Patient settled in on unit, spouse at bedside.

## 2017-09-01 NOTE — ED Notes (Signed)
Per floor RN, they cannot take pt as he is having active 6/10 chest pain. Cardiologist paged, waiting for call back.

## 2017-09-01 NOTE — ED Provider Notes (Signed)
[facility]ENC[facility] Provider Note   CSN: [id number] Arrival date & time: 09/01/17  1414  History   Chief Complaint Chief Complaint  Patient presents with  . Chest Pain  . Cough   HPI Vincent Mcpherson is a 36 y.o. male with a PMHx significant for A-fib who presented to the ED with CP and SOB of acute onset. He states that yesterday he had 6 beers, 1 pack of cigarettes, and some cocaine. He then noted his heart was beating funny. This AM he woke up with intermittent chest pain and SOB. He has a history of atrial fibrillation after excessive alcohol and cocaine use in the past. He has been having off-and-on palpitations for the past 1 week. His last use of cocaine was yesterday evening 10 pm.    He was previously feeling well with no recent illness, headaches, abdominal pain, N/V, diarrhea, constipation, dysuria, new myalgias or arthralgias.   Past Medical History:  Diagnosis Date  . Atrial fibrillation (HCC)   . Hypertension   . Palpitations   . Syncope 05/04/12   [facility]  . Varicose vein    Patient Active Problem List   Diagnosis Date Noted  . Atrial fibrillation (HCC) 09/03/2012  . Syncope 09/03/2012  . OSA (obstructive sleep apnea) 09/03/2012   History reviewed. No pertinent surgical history.  Home Medications    Prior to Admission medications   Medication Sig Start Date End Date Taking? Authorizing Provider  albuterol (PROVENTIL HFA;VENTOLIN HFA) 108 (90 Base) MCG/ACT inhaler Inhale 1-2 puffs into the lungs every 6 (six) hours as needed for wheezing or shortness of breath. 03/20/17  Yes Deatra Canterxford, William J, FNP  clindamycin (CLEOCIN) 300 MG capsule Take one capsule by mouth three times daily for one week. Patient not taking: Reported on 09/01/2017 08/09/17   Mardella LaymanHagler, Brian, MD  HYDROcodone-acetaminophen (NORCO/VICODIN) 5-325 MG tablet Take 1 tablet by mouth every 6 (six) hours as needed for moderate pain or severe  pain. Patient not taking: Reported on 09/01/2017 08/09/17   Mardella LaymanHagler, Brian, MD  hydrOXYzine (ATARAX/VISTARIL) 25 MG tablet Take one po q 6 hours prn anxiety Patient not taking: Reported on 09/01/2017 03/20/17   Deatra Canterxford, William J, FNP   Family History Family History  Problem Relation Age of Onset  . Atrial fibrillation Mother     Social History Social History   Tobacco Use  . Smoking status: Current Every Day Smoker    Packs/day: 1.00  . Smokeless tobacco: Never Used  Substance Use Topics  . Alcohol use: Yes  . Drug use: Yes    Types: Marijuana   Allergies   Penicillins  Review of Systems All systems reviewed and are negative for acute change except as noted in the HPI.  Physical Exam Updated Vital Signs BP 124/73   Pulse 87   Resp 19   Ht 5\' 10"  (1.778 m)   Wt 122.5 kg (270 lb)   SpO2 99%   BMI 38.74 kg/m   General: Obese male in visible distress  HENT: Normocephalic, atraumatic, conjunctivitis of right eye  Pulm: Tachypnea with increased work of breathing, no wheezing or crackles noted CV: Tachycardic with irregular rhythm  Abdomen: Soft, non-distended, no tenderness to palpation  Extremities: No LE edema, radial pulses palpable bilaterally  Skin: Warm and dry, no rashes Neuro: Alert and oriented x 3  ED Treatments / Results  Labs (all labs ordered are listed, but only abnormal results are displayed) Labs Reviewed  COMPREHENSIVE METABOLIC PANEL  CBC WITH DIFFERENTIAL/PLATELET   EKG  EKG Interpretation  Date/Time:  Friday September 01 2017 14:19:21 EST Ventricular Rate:  166 PR Interval:    QRS Duration: 88 QT Interval:  280 QTC Calculation: 465 R Axis:   61 Text Interpretation:  Atrial fibrillation with rapid ventricular response Marked ST abnormality, possible inferior subendocardial injury Abnormal ECG Otherwise no significant change Confirmed by Melene PlanFloyd, Dan [phone]([id number]) on 09/01/2017 2:45:15 PM      Radiology No results found.  Procedures Procedures  (including critical care time)  Medications Ordered in ED Medications  sodium chloride 0.9 % bolus 1,000 mL (1,000 mLs Intravenous New Bag/Given 09/01/17 1543)  diazepam (VALIUM) tablet 5 mg (5 mg Oral Given 09/01/17 1543)  verapamil (ISOPTIN) injection 10 mg (10 mg Intravenous Given 09/01/17 1543)   Initial Impression / Assessment and Plan / ED Course  I have reviewed the triage vital signs and the nursing notes.  Pertinent labs & imaging results that were available during my care of the patient were reviewed by me and considered in my medical decision making (see chart for details).    Patient presented with palpitations, SOB, and CP of acute onset after excessive use of alcohol and cocaine, initial EKG showing A-fib with RVR . Last use of cocaine was at 10 pm on 12/13 and the patient states he has been having palpitations on-and-off for approximately 1 week. Therefore we will not cardiovert and attempt to rate control the patient. Spoke with pharmacy who recommended verapamil pushes with possible transition to drip. He was given IVF, valium, and verapamil with significant improvement in his HR.   CHADs2VASc score of 1.   Cardiology has been consulted, they will come to evaluate and admit the patient. Discussed with the patient that he will need to be admitted for further management. He agrees with the plan. All questions and concerns addressed.   Final Clinical Impressions(s) / ED Diagnoses   Final diagnoses:  Atrial fibrillation, unspecified type [facility](HCC)   ED Discharge Orders    None       Levora DredgeHelberg, Isis Costanza, MD 09/01/17 1734    Melene PlanFloyd, Dan, DO 09/01/17 [id number]

## 2017-09-01 NOTE — ED Notes (Signed)
Pt states he is not having any chest pain at this time.

## 2017-09-01 NOTE — Consult Note (Deleted)
Cardiology Consultation:   Patient ID: Vincent Mcpherson; [id number]; 05/20/1981   Admit date: 09/01/2017 Date of Consult: 09/01/2017  Primary Care Provider: Patient, No Pcp Per Primary Cardiologist: [facility]  Patient Profile:   Vincent LeveringCameron D Mcpherson is a 36 y.o. male with a hx of paroxysmal atrial fibrillation, syncope, polysubstance abuse, and reported history of OSA who is being seen today for the evaluation of atrial fibrillation with rapid ventricular rate at the request of Dr. Adela LankFloyd.  History of alcohol induced atrial fibrillation. He was [facility].  He has daytime sleepiness and loud snoring. He has not been formally diagnosed by sleep study.  History of syncope August 2013--> vasovagal versus tachycardia mediated.  Echocardiogram January 2014 showed normal LV function of 50-55%, mildly dilated left atrium and right atrium.  History of Present Illness:   Vincent Mcpherson has ongoing history of cocaine and marijuana abuse with 2 pack tobacco smoking every day.  He drinks a few beers every night.  History of palpitation for many years.  His palpitation lasts for a few minutes with self resolution.  Last night he drank a few beers with use of cocaine.  He has palpitations and sharp chest pain since this morning.  He came to ER as it did not resolve.  No regular exercise.  He does not take any medication regularly.  EKG shows atrial fibrillation at a rapid ventricular rate of 166 bpm -personally reviewed.  He was given Valium 5 mg and verapamil 10 mg injection.  Past Medical History:  Diagnosis Date  . Atrial fibrillation (HCC)   . Hypertension   . Palpitations   . Syncope 05/04/12   [facility]  . Varicose vein     History reviewed. No pertinent surgical history.    Inpatient Medications: Scheduled Meds:  As discussed above.  Continuous Infusions:  PRN Meds:   Allergies:    Allergies  Allergen Reactions  . Penicillins Rash      Has patient had a PCN reaction causing immediate rash, facial/tongue/throat swelling, SOB or lightheadedness with hypotension: Yes Has patient had a PCN reaction causing severe rash involving mucus membranes or skin necrosis: Yes Has patient had a PCN reaction that required hospitalization: Yes Has patient had a PCN reaction occurring within the last 10 years: No If all of the above answers are "NO", then may proceed with Cephalosporin use.    Social History:   Social History   Socioeconomic History  . Marital status: Single    Spouse name: Not on file  . Number of children: Not on file  . Years of education: Not on file  . Highest education level: Not on file  Social Needs  . Financial resource strain: Not on file  . Food insecurity - worry: Not on file  . Food insecurity - inability: Not on file  . Transportation needs - medical: Not on file  . Transportation needs - non-medical: Not on file  Occupational History  . Not on file  Tobacco Use  . Smoking status: Current Every Day Smoker    Packs/day: 1.00  . Smokeless tobacco: Never Used  Substance and Sexual Activity  . Alcohol use: Yes  . Drug use: Yes    Types: Marijuana  . Sexual activity: Not on file  Other Topics Concern  . Not on file  Social History Narrative  . Not on file    Family History:    Family  History  Problem Relation Age of Onset  . Atrial fibrillation Mother      ROS:  Please see the history of present illness.  ROS All other ROS reviewed and negative.     Physical Exam/Data:   Vitals:   09/01/17 1543 09/01/17 1545 09/01/17 1547 09/01/17 1549  BP: 121/68 105/86 121/69 124/73  Pulse:  (!) 47 (!) 108 87  Resp:  (!) 32 (!) 30 19  SpO2:  97% 96% 99%  Weight:      Height:       No intake or output data in the 24 hours ending 09/01/17 1714 Filed Weights   09/01/17 1441  Weight: 270 lb (122.5 kg)   Body mass index is 38.74 kg/m.  General:  Well nourished, well developed, in no acute  distress HEENT: normal Lymph: no adenopathy Neck: no JVD Endocrine:  No thryomegaly Vascular: No carotid bruits; FA pulses 2+ bilaterally without bruits  Cardiac:  normal S1, S2; irregularly irregular tachycardia; no murmur  Lungs:  clear to auscultation bilaterally, no wheezing, rhonchi or rales  Abd: soft, nontender, no hepatomegaly  Ext: Trace bilateral lower extremity edema Musculoskeletal:  No deformities, BUE and BLE strength normal and equal Skin: warm and dry  Neuro:  CNs 2-12 intact, no focal abnormalities noted Psych:  Normal affect   Relevant CV Studies: As summarized above  Laboratory Data:  Pending labs  Radiology/Studies:  No results found.  Assessment and Plan:   1. Atrial fibrillation with a rate - In setting of alcohol and cocaine abuse. Rate improved after IV Varapamil. Control rate with IV cardizem. No BB given cocaine abuse. Hx unreliable to perform cardioversion in ER. Xarelto 20mg  qd. If not converted, TEE/DCCV and 4 weeks of anticoagulation. CHADSVASC score of 0. No prior hx of bleeding.   2.  Polysubstance abuse - Advised cessation.  Education given. Pending UDS.   3. Chest pain - Likely rate related vs cocaine induced. Currently chest pain free.   Family medicine to admit for further evaluation.   For questions or updates, please contact [facility] Please consult [email or web address] for contact info under Cardiology/STEMI.   Lorelei PontSigned, Bliss Behnke, [place]  09/01/2017 5:14 PM

## 2017-09-01 NOTE — H&P (Signed)
Cardiology Admission History and Physical:   Patient ID: OAKLAN PERSONS; MRN: [id number]; DOB: [birth year]   Admission date: 09/01/2017  Patient ID: Vincent Mcpherson; [id number]; 18-Apr-1981   Admit date: 09/01/2017 Date of Consult: 09/01/2017  Primary Care Provider: Patient, No Pcp Per Primary Cardiologist: New to [facility]    Patient Profile:   Vincent Mcpherson is a 36 y.o. male with a hx of paroxysmal atrial fibrillation, syncope, polysubstance abuse, and reported history of OSA who is being seen today for the evaluation of atrial fibrillation with rapid ventricular rate at the request of Dr. Adela Lank.  History of alcohol induced atrial fibrillation. He was told he had sleep apnea at [facility] in 2010. He has daytime sleepiness and loud snoring. He has not been formally diagnosed by sleep study.  History of syncope August 2013--> vasovagal versus tachycardia mediated.  Echocardiogram January 2014 showed normal LV function of 50-55%, mildly dilated left atrium and right atrium.    History of Present Illness:   Mr. Koman has ongoing history of cocaine and marijuana abuse with 2 pack tobacco smoking every day.  He drinks a few beers every night.  History of palpitation for many years.  His palpitation lasts for a few minutes with self resolution.  Last night he drank a few beers with use of cocaine.  He has palpitations and sharp chest pain since this morning.  He came to ER as it did not resolve.  No regular exercise.  He does not take any medication regularly.  EKG shows atrial fibrillation at a rapid ventricular rate of 166 bpm -personally reviewed.  He was given Valium 5 mg and verapamil 10 mg injection.   Past Medical History:  Diagnosis Date  . Atrial fibrillation (HCC)   . Hypertension   . Palpitations   . Syncope 05/04/12   while visiting another pt at [facility]  . Varicose vein     History reviewed. No pertinent surgical history.   Medications Prior to Admission: Prior to  Admission medications   Medication Sig Start Date End Date Taking? Authorizing Provider  albuterol (PROVENTIL HFA;VENTOLIN HFA) 108 (90 Base) MCG/ACT inhaler Inhale 1-2 puffs into the lungs every 6 (six) hours as needed for wheezing or shortness of breath. 03/20/17  Yes Deatra Canter, FNP  clindamycin (CLEOCIN) 300 MG capsule Take one capsule by mouth three times daily for one week. Patient not taking: Reported on 09/01/2017 08/09/17   Mardella Layman, MD  HYDROcodone-acetaminophen (NORCO/VICODIN) 5-325 MG tablet Take 1 tablet by mouth every 6 (six) hours as needed for moderate pain or severe pain. Patient not taking: Reported on 09/01/2017 08/09/17   Mardella Layman, MD  hydrOXYzine (ATARAX/VISTARIL) 25 MG tablet Take one po q 6 hours prn anxiety Patient not taking: Reported on 09/01/2017 03/20/17   Deatra Canter, FNP     Allergies:    Allergies  Allergen Reactions  . Penicillins Rash    Has patient had a PCN reaction causing immediate rash, facial/tongue/throat swelling, SOB or lightheadedness with hypotension: Yes Has patient had a PCN reaction causing severe rash involving mucus membranes or skin necrosis: Yes Has patient had a PCN reaction that required hospitalization: Yes Has patient had a PCN reaction occurring within the last 10 years: No If all of the above answers are "NO", then may proceed with Cephalosporin use.    Social History:   Social History   Socioeconomic History  . Marital status: Single    Spouse name: Not on file  .  Number of children: Not on file  . Years of education: Not on file  . Highest education level: Not on file  Social Needs  . Financial resource strain: Not on file  . Food insecurity - worry: Not on file  . Food insecurity - inability: Not on file  . Transportation needs - medical: Not on file  . Transportation needs - non-medical: Not on file  Occupational History  . Not on file  Tobacco Use  . Smoking status: Current Every Day Smoker     Packs/day: 1.00  . Smokeless tobacco: Never Used  Substance and Sexual Activity  . Alcohol use: Yes  . Drug use: Yes    Types: Marijuana  . Sexual activity: Not on file  Other Topics Concern  . Not on file  Social History Narrative  . Not on file    Family History:   The patient's family history includes Atrial fibrillation in his mother.    ROS:  Please see the history of present illness.  All other ROS reviewed and negative.     Physical Exam/Data:   Vitals:   09/01/17 1543 09/01/17 1545 09/01/17 1547 09/01/17 1549  BP: 121/68 105/86 121/69 124/73  Pulse:  (!) 47 (!) 108 87  Resp:  (!) 32 (!) 30 19  SpO2:  97% 96% 99%  Weight:      Height:       No intake or output data in the 24 hours ending 09/01/17 1733 Filed Weights   09/01/17 1441  Weight: 270 lb (122.5 kg)   Body mass index is 38.74 kg/m.  General:  Well nourished, well developed, in no acute distress HEENT: normal Lymph: no adenopathy Neck: no JVD Endocrine:  No thryomegaly Vascular: No carotid bruits; FA pulses 2+ bilaterally without bruits  Cardiac:  normal S1, S2; irregularly irregular tachycardia; no murmur  Lungs:  clear to auscultation bilaterally, no wheezing, rhonchi or rales  Abd: soft, nontender, no hepatomegaly  Ext: Trace bilateral lower extremity edema Musculoskeletal:  No deformities, BUE and BLE strength normal and equal Skin: warm and dry  Neuro:  CNs 2-12 intact, no focal abnormalities noted Psych:  Normal affect    Relevant CV Studies: As above  Laboratory Data: Pending labs  Radiology/Studies:  No results found.  Assessment and Plan:   1. Atrial fibrillation with a rate - In setting of alcohol and cocaine abuse. Rate improved after IV Varapamil. Control rate with IV cardizem. No BB given cocaine abuse. Hx unreliable to perform cardioversion in ER. Xarelto 20mg  qd. If not converted, TEE/DCCV and 4 weeks of anticoagulation. CHADSVASC score of 0. No prior hx of bleeding.    2.  Polysubstance abuse - Advised cessation.  Education given. Pending UDS.   3. Chest pain - Likely rate related vs cocaine induced. Currently chest pain free.     Severity of Illness: The appropriate patient status for this patient is OBSERVATION. Observation status is judged to be reasonable and necessary in order to provide the required intensity of service to ensure the patient's safety. The patient's presenting symptoms, physical exam findings, and initial radiographic and laboratory data in the context of their medical condition is felt to place them at decreased risk for further clinical deterioration. Furthermore, it is anticipated that the patient will be medically stable for discharge from the hospital within 2 midnights of admission. The following factors support the patient status of observation.   " The patient's presenting symptoms include afib and chest pain  "  The physical exam findings include elevated rate " The initial radiographic and laboratory data are afib  rvr     For questions or updates, please contact [facility] Please consult [email or web address] for contact info under Cardiology/STEMI.    Lorelei PontSigned, Bhavinkumar Bhagat, [place]  09/01/2017 5:33 PM   History and all data above reviewed.  Patient examined.  I agree with the findings as above.  The patient had palpitations this morning after using drugs and alcohol as above.  However, on talking to him it is unclear whether he was having palpitations prior to this.  He has had a history of atrial fib in the past and does feel an irregular heart rhythm at times.  However, this was particularly severe this morning with chest pain and rapid rate so he came to the ED.  The chest pain is a vague and not significant component of this.   The patient exam reveals FAO:ZHYQMVHQICOR:Irregular  ,  Lungs: Clear  ,  Abd: Positive bowel sounds, no rebound no guarding, Ext No edema  .  All available labs, radiology testing, previous records  reviewed. Agree with documented assessment and plan. Atrial fib:  Unsure when exactly this started.  He will be admitted with IV Cardizem for rate control.  Although  Mr. Vincent Mcpherson has a CHA2DS2 - VASc score of 0 I will start Xarelto in anticipation that he might need TEE/DCCV if he has continued fib particularly with rapid rate.  We talked about the probable cause and effect of the substance abuse.  Check a thyroid profile.      Fayrene FearingJames Idelle Reimann  6:05 PM  09/01/2017

## 2017-09-01 NOTE — ED Triage Notes (Signed)
Cough and cp started last night vomited this am pt hyperventilating , hurts to take a deep  brteath

## 2017-09-01 NOTE — ED Notes (Signed)
Attempted to call report x 1  

## 2017-09-02 LAB — TROPONIN I
Troponin I: <0.03 ng/mL (ref <0.03)
Troponin I: <0.03 ng/mL (ref <0.03)

## 2017-09-02 LAB — HIV ANTIBODY (ROUTINE TESTING W REFLEX): HIV SCREEN 4TH GENERATION: NONREACTIVE

## 2017-09-02 MED ORDER — DILTIAZEM HCL 30 MG PO TABS: ORAL_TABLET | ORAL | 2 refills | Status: DC

## 2017-09-02 NOTE — Progress Notes (Signed)
Patients Hr has been WNL, below 100 most of the night. HR in the 90's-low 100's.

## 2017-09-02 NOTE — Plan of Care (Signed)
  Clinical Measurements: Ability to maintain clinical measurements within normal limits will improve 09/02/2017 0230 - Progressing by Jeanella Flatteryhomas, Samreen Seltzer T, RN   Coping: Level of anxiety will decrease 09/02/2017 0230 - Progressing by Jeanella Flatteryhomas, Secret Kristensen T, RN

## 2017-09-02 NOTE — Plan of Care (Signed)
Discharge instructions reviewed with patient and girlfriend, questions answered, verbalized understanding.  Patient given work note.  Patient transported to main entrance of the hospital to be taken home by girlfriend.

## 2017-09-02 NOTE — Progress Notes (Signed)
Diltiazem bag not scanning properly, RN double checked order, called pharmacy and pharmacy agrees that order/medication is active and correct. RN let a 2nd RN check order before overriding scan and hanging replacement bag.

## 2017-09-02 NOTE — Discharge Summary (Signed)
Discharge Summary    Patient ID: Vincent Mcpherson,  MRN: [id number], DOB/AGE: 35/02/1981 35 y.o.  Admit date: 09/01/2017 Discharge date: 09/02/2017  Primary Care Provider: Patient, No Pcp Per Primary Cardiologist: Dr. Antoine PocheHochrein   Discharge Diagnoses    Active Problems:   Atrial fibrillation with RVR (HCC)   Poly substance abuse  Allergies Allergies  Allergen Reactions  . Penicillins Rash    Has patient had a PCN reaction causing immediate rash, facial/tongue/throat swelling, SOB or lightheadedness with hypotension: Yes Has patient had a PCN reaction causing severe rash involving mucus membranes or skin necrosis: Yes Has patient had a PCN reaction that required hospitalization: Yes Has patient had a PCN reaction occurring within the last 10 years: No If all of the above answers are "NO", then may proceed with Cephalosporin use.    Diagnostic Studies/Procedures    None   History of Present Illness     Vincent Mcpherson a 36 y.o.malewith a hx of paroxysmal atrial fibrillation, syncope, polysubstance abuse, and reported history of OSAwho presented with palpitation and found to have afib RVR.   History of alcohol induced atrial fibrillation.He was told [facility]. He has daytime sleepiness and loud snoring. He has not been formally diagnosed by sleep study.History of syncope August 2013-->vasovagal versus tachycardia mediated.  Echocardiogram January 2014 showed normal LV function of 50-55%, mildly dilated left atrium and right atrium.  Mr.Gradyhas ongoing history of cocaine and marijuana abuse with 2 pack tobacco smoking every day. He drinks a few beers every night. History of palpitation for many years. His palpitation lasts for a few minutes with self resolution. Last night he drank a few beers with use of cocaine. He has palpitations and sharp chest pain since this morning. He came to ER as it did not resolve. No regular  exercise. He does not take any medication regularly.  EKG shows atrial fibrillation at a rapid ventricular rate of 166 bpm-personally reviewed. He was given Valium 5 mg and verapamil 10 mg injection.   Hospital Course     Consultants: None  The patient was admitted and started on IV Cardizem and Xarelto for possible TEE/ DCCV. He converted to sinus rhythm overnight. Troponin negative. TSH normal. No recurrent chest pain. Advised cessation of cocaine, alcohol and marijuana. No need for anticoagulation. Vincent Mcpherson give PRN Cardizem.   The patient has been seen by Dr. Elberta Fortisamnitz  today and deemed ready for discharge home. All follow-up appointments have been scheduled. Discharge medications are listed below.   Discharge Vitals Blood pressure 119/70, pulse 78, temperature 98 F (36.7 C), temperature source Oral, resp. rate 20, height 5\' 10"  (1.778 m), weight 276 lb 14.4 oz (125.6 kg), SpO2 97 %.  Filed Weights   09/01/17 1441 09/01/17 2107 09/02/17 0626  Weight: 270 lb (122.5 kg) 276 lb 6.4 oz (125.4 kg) 276 lb 14.4 oz (125.6 kg)   Physical Exam  Constitutional: He is oriented to person, place, and time and well-developed, well-nourished, and in no distress.  HENT:  Head: Normocephalic and atraumatic.  Eyes: EOM are normal. Pupils are equal, round, and reactive to light.  Neck: Normal range of motion. Neck supple.  Cardiovascular: Regular rhythm.  Pulmonary/Chest: Effort normal and breath sounds normal.  Abdominal: Soft.  Musculoskeletal: Normal range of motion.  Neurological: He is alert and oriented to person, place, and time.  Skin: Skin is warm and dry.  Psychiatric: Affect normal.    Labs &  Radiologic Studies     CBC Recent Labs    09/01/17 1623  WBC 11.3*  NEUTROABS 7.5  HGB 13.8  HCT 42.4  MCV 94.4  PLT 320   Basic Metabolic Panel Recent Labs    40/98/1112/14/18 1623  NA 137  K 3.7  CL 104  CO2 25  GLUCOSE 94  BUN 12  CREATININE 1.16  CALCIUM 9.0   Liver Function  Tests Recent Labs    09/01/17 1623  AST 30  ALT 19  ALKPHOS 48  BILITOT 1.4*  PROT 6.5  ALBUMIN 3.6   No results for input(s): LIPASE, AMYLASE in the last 72 hours. Cardiac Enzymes Recent Labs    09/01/17 2207 09/02/17 0406  TROPONINI <0.03 <0.03   Thyroid Function Tests Recent Labs    09/01/17 2207  TSH 1.441    No results found.  Disposition   Pt is being discharged home today in good condition.  Follow-up Plans & Appointments    Follow-up Information    Rollene RotundaHochrein, James, MD. Go on 10/20/2017.   Specialty:  Cardiology Why:  @9 :20 am for post hospital follow up Contact information: [address] [place] [address] [phone]          Discharge Instructions    Diet - low sodium heart healthy   Complete by:  As directed    Increase activity slowly   Complete by:  As directed       Discharge Medications   Allergies as of 09/02/2017      Reactions   Penicillins Rash   Has patient had a PCN reaction causing immediate rash, facial/tongue/throat swelling, SOB or lightheadedness with hypotension: Yes Has patient had a PCN reaction causing severe rash involving mucus membranes or skin necrosis: Yes Has patient had a PCN reaction that required hospitalization: Yes Has patient had a PCN reaction occurring within the last 10 years: No If all of the above answers are "NO", then may proceed with Cephalosporin use.      Medication List    STOP taking these medications   clindamycin 300 MG capsule Commonly known as:  CLEOCIN   HYDROcodone-acetaminophen 5-325 MG tablet Commonly known as:  NORCO/VICODIN   hydrOXYzine 25 MG tablet Commonly known as:  ATARAX/VISTARIL     TAKE these medications   albuterol 108 (90 Base) MCG/ACT inhaler Commonly known as:  PROVENTIL HFA;VENTOLIN HFA Inhale 1-2 puffs into the lungs every 6 (six) hours as needed for wheezing or shortness of breath.   diltiazem 30 MG tablet Commonly known as:   CARDIZEM Take 1 table PRN every 6 hours for heart rate above 115 bpm.        Outstanding Labs/Studies   None  Duration of Discharge Encounter   Greater than 30 minutes including physician time.  Signed, Sharrell KuBhavinkumar Bhagat PA-C 09/02/2017, 10:22 AM  I have seen and examined this patient with Vin Bhagat.  Agree with above, note added to reflect my findings.  On exam, RRR, no murmurs, lungs clear.  Presented to the hospital yesterday with palpitations, found to be in atrial fibrillation.  The night before, had used cocaine and alcohol.  Was put on a diltiazem drip and converted to sinus rhythm.  Demetrie Borge plan for discharge today with follow-up in cardiology clinic.  Aron Needles discharge with as needed diltiazem.  I did advise him to abstain from alcohol and cocaine.  Cevin Rubinstein M. Oni Dietzman MD 09/02/2017 10:25 AM

## 2017-09-02 NOTE — Progress Notes (Signed)
Patient says he hasn't urinated all night. I explained to patient that he would need a bladder scan and possibly a in/out cath,if he goes any longer without urinating. Patient requests more water to drink and says he will try to use the urinal before he has to go through that.

## 2017-09-02 NOTE — Progress Notes (Signed)
Pharmacist came to room to verify medication, gave patient new barcode/medication. RN re scanned medication.

## 2017-10-18 NOTE — Progress Notes (Deleted)
Cardiology Office Note   Date:  10/18/2017   ID:  Vincent Mcpherson, DOB 1980-10-14, MRN [id number]  PCP:  Patient, No Pcp Per  Cardiologist:   No primary care provider on file. Referring:  ***  No chief complaint on file.     History of Present Illness: Vincent Mcpherson is a 37 y.o. male who presents for follow up of atrial fib.  He was in the hospital for this recently.   He has polysubstance abuse with cocaine use and marijuana use.   ***    Vincent Mcpherson a 37 y.o.malewith a hx of paroxysmal atrial fibrillation, syncope, polysubstance abuse, and reported history of OSAwho is being seen today for the evaluation of atrial fibrillation with rapid ventricular rateat the request of Dr. Adela Lank.  History of alcohol induced atrial fibrillation.He was told he had sleep apnea at [facility] in 2010. He has daytime sleepiness and loud snoring. He has not been formally diagnosed by sleep study.History of syncope August 2013-->vasovagal versus tachycardia mediated.  Echocardiogram January 2014 showed normal LV function of 50-55%, mildly dilated left atrium and right atrium.     Past Medical History:  Diagnosis Date  . Atrial fibrillation (HCC)   . Hypertension   . Palpitations   . Syncope 05/04/12   while visiting another pt at [facility]  . Varicose vein     No past surgical history on file.   Current Outpatient Medications  Medication Sig Dispense Refill  . albuterol (PROVENTIL HFA;VENTOLIN HFA) 108 (90 Base) MCG/ACT inhaler Inhale 1-2 puffs into the lungs every 6 (six) hours as needed for wheezing or shortness of breath. 1 Inhaler 0  . diltiazem (CARDIZEM) 30 MG tablet Take 1 table PRN every 6 hours for heart rate above 115 bpm. 30 tablet 2   No current facility-administered medications for this visit.     Allergies:   Penicillins    Social History:  The patient  reports that he has been smoking.  He has been smoking about 1.00 pack per day. he has never used smokeless  tobacco. He reports that he drinks alcohol. He reports that he uses drugs. Drugs: Marijuana and Cocaine.   Family History:  The patient's ***family history includes Atrial fibrillation in his mother.    ROS:  Please see the history of present illness.   Otherwise, review of systems are positive for {NONE DEFAULTED:18576::"none"}.   All other systems are reviewed and negative.    PHYSICAL EXAM: VS:  There were no vitals taken for this visit. , BMI There is no height or weight on file to calculate BMI. GENERAL:  Well appearing HEENT:  Pupils equal round and reactive, fundi not visualized, oral mucosa unremarkable NECK:  No jugular venous distention, waveform within normal limits, carotid upstroke brisk and symmetric, no bruits, no thyromegaly LYMPHATICS:  No cervical, inguinal adenopathy LUNGS:  Clear to auscultation bilaterally BACK:  No CVA tenderness CHEST:  Unremarkable HEART:  PMI not displaced or sustained,S1 and S2 within normal limits, no S3, no S4, no clicks, no rubs, *** murmurs ABD:  Flat, positive bowel sounds normal in frequency in pitch, no bruits, no rebound, no guarding, no midline pulsatile mass, no hepatomegaly, no splenomegaly EXT:  2 plus pulses throughout, no edema, no cyanosis no clubbing SKIN:  No rashes no nodules NEURO:  Cranial nerves II through XII grossly intact, motor grossly intact throughout PSYCH:  Cognitively intact, oriented to person place and time    EKG:  EKG {ACTION;  IS/IS [id number]}OT:[id number]} ordered today. The ekg ordered today demonstrates ***   Recent Labs: 09/01/2017: ALT 19; BUN 12; Creatinine, Ser 1.16; Hemoglobin 13.8; Platelets 320; Potassium 3.7; Sodium 137; TSH 1.441    Lipid Panel No results found for: CHOL, TRIG, HDL, CHOLHDL, VLDL, LDLCALC, LDLDIRECT    Wt Readings from Last 3 Encounters:  09/02/17 276 lb 14.4 oz (125.6 kg)  02/02/17 270 lb (122.5 kg)  11/26/14 270 lb (122.5 kg)      Other studies Reviewed: Additional studies/  records that were reviewed today include: ***. Review of the above records demonstrates:  Please see elsewhere in the note.  ***   ASSESSMENT AND PLAN:  ATRIAL FIB:  ***  POLYSUBSTANCE ABUSE:  ***  CHEST PAIN:  ***   Current medicines are reviewed at length with the patient today.  The patient {ACTIONS; HAS/DOES NOT HAVE:19233} concerns regarding medicines.  The following changes have been made:  {PLAN; NO CHANGE:13088:s}  Labs/ tests ordered today include: *** No orders of the defined types were placed in this encounter.    Disposition:   FU with ***    Signed, Rollene RotundaJames Nason Conradt, MD  10/18/2017 9:09 PM    [facility]  '

## 2017-10-20 ENCOUNTER — Ambulatory Visit: Payer: Self-pay | Admitting: Cardiology

## 2017-10-24 ENCOUNTER — Telehealth: Payer: Self-pay | Admitting: Cardiology

## 2017-11-02 ENCOUNTER — Other Ambulatory Visit: Payer: Self-pay

## 2017-11-02 ENCOUNTER — Ambulatory Visit (HOSPITAL_COMMUNITY)
Admission: EM | Admit: 2017-11-02 | Discharge: 2017-11-02 | Disposition: A | Discharge status: Home / Self Care | Payer: Self-pay | Attending: Internal Medicine | Admitting: Internal Medicine

## 2017-11-02 ENCOUNTER — Encounter (HOSPITAL_COMMUNITY): Payer: Self-pay | Admitting: Emergency Medicine

## 2017-11-02 DIAGNOSIS — M545 Low back pain, unspecified: Secondary | ICD-10-CM

## 2017-11-02 MED ORDER — CYCLOBENZAPRINE HCL 10 MG PO TABS: 5.0000 mg | ORAL_TABLET | Freq: Every evening | ORAL | 0 refills | Status: DC | PRN

## 2017-11-02 MED ORDER — POLYETHYLENE GLYCOL 3350 17 G PO PACK: 17.0000 g | PACK | Freq: Every day | ORAL | 0 refills | Status: DC

## 2017-11-02 MED ORDER — MELOXICAM 7.5 MG PO TABS: 7.5000 mg | ORAL_TABLET | Freq: Every day | ORAL | 0 refills | Status: DC

## 2017-11-02 NOTE — ED Triage Notes (Signed)
Complains of back pain.  Onset of pain 2 days ago.  .lower back pain, mainly on the left.  No known injury

## 2017-11-02 NOTE — ED Provider Notes (Signed)
[facility]    CSN: [id number] Arrival date & time: 11/02/17  1647     History   Chief Complaint Chief Complaint  Patient presents with  . Back Pain    HPI Vincent Mcpherson is a 37 y.o. male.   37 year old male comes in for 2-day history of left lower back pain.  Denies injury, states he woke up with the pain.  States dull/aching, worse with movement.  Denies numbness, tingling, loss of bladder or bowel control.  Denies radiation of pain.  Took Aleve with temporary relief.  Also states he has had constipation with straining during bowel movements.  Says he moved his bowels yesterday, which helped with the pain.      Past Medical History:  Diagnosis Date  . Atrial fibrillation (HCC)   . Hypertension   . Palpitations   . [facility]ync[facility] 05/04/12   [facility]  . Varicose vein     Patient Active Problem List   Diagnosis Date Noted  . Atrial fibrillation with RVR (HCC) 09/01/2017  . Atrial fibrillation (HCC) 09/03/2012  . Syncope 09/03/2012  . OSA (obstructive sleep apnea) 09/03/2012    History reviewed. No pertinent surgical history.     Home Medications    Prior to Admission medications   Medication Sig Start Date End Date Taking? Authorizing Provider  Naproxen Sodium (ALEVE PO) Take by mouth.   Yes [provider]  albuterol (PROVENTIL HFA;VENTOLIN HFA) 108 (90 Base) MCG/ACT inhaler Inhale 1-2 puffs into the lungs every 6 (six) hours as needed for wheezing or shortness of breath. 03/20/17   Deatra Canterxford, William J, FNP  cyclobenzaprine (FLEXERIL) 10 MG tablet Take 0.5-1 tablets (5-10 mg total) by mouth at bedtime as needed for muscle spasms. 11/02/17   Cathie HoopsYu, Amy V, PA-C  diltiazem (CARDIZEM) 30 MG tablet Take 1 table PRN every 6 hours for heart rate above 115 bpm. 09/02/17   Bhagat, Bhavinkumar, PA  meloxicam (MOBIC) 7.5 MG tablet Take 1 tablet (7.5 mg total) by mouth daily. 11/02/17   Cathie HoopsYu, Amy V, PA-C  polyethylene glycol (MIRALAX) packet Take  17 g by mouth daily. 11/02/17   Belinda FisherYu, Amy V, PA-C    Family History Family History  Problem Relation Age of Onset  . Atrial fibrillation Mother     Social History Social History   Tobacco Use  . Smoking status: Current Every Day Smoker    Packs/day: 1.00  . Smokeless tobacco: Never Used  Substance Use Topics  . Alcohol use: Yes  . Drug use: Yes    Types: Marijuana, Cocaine    Comment: Pt states cocaine use is recreational     Allergies   Penicillins   Review of Systems Review of Systems  Reason unable to perform ROS: See HPI as above.     Physical Exam Triage Vital Signs ED Triage Vitals  Enc Vitals Group     BP 11/02/17 1828 133/75     Pulse Rate 11/02/17 1828 84     Resp 11/02/17 1828 (!) 22     Temp 11/02/17 1828 97.8 F (36.6 C)     Temp Source 11/02/17 1828 Oral     SpO2 11/02/17 1828 100 %     Weight --      Height --      Head Circumference --      Peak Flow --      Pain Score 11/02/17 1826 10     Pain Loc --  Pain Edu? --      Excl. in GC? --    No data found.  Updated Vital Signs BP 133/75 (BP Location: Right Arm) Comment: large cuff  Pulse 84   Temp 97.8 F (36.6 C) (Oral)   Resp (!) 22   SpO2 100%   Physical Exam  Constitutional: He is oriented to person, place, and time. He appears well-developed and well-nourished. No distress.  HENT:  Head: Normocephalic and atraumatic.  Eyes: Conjunctivae are normal. Pupils are equal, round, and reactive to light.  Cardiovascular: Normal rate, regular rhythm and normal heart sounds. Exam reveals no gallop and no friction rub.  No murmur heard. Pulmonary/Chest: Effort normal and breath sounds normal. He has no wheezes. He has no rales.  Musculoskeletal:  No tenderness on palpation of the spinous processes, right back. Tenderness on palpation of left lumbar region. Full range of motion hip and back. Strength normal and equal bilaterally. Sensation intact and equal bilaterally.  Negative straight  leg raise   Neurological: He is alert and oriented to person, place, and time.  Skin: Skin is warm and dry.     UC Treatments / Results  Labs (all labs ordered are listed, but only abnormal results are displayed) Labs Reviewed - No data to display  EKG  EKG Interpretation None       Radiology No results found.  Procedures Procedures (including critical care time)  Medications Ordered in UC Medications - No data to display   Initial Impression / Assessment and Plan / UC Course  I have reviewed the triage vital signs and the nursing notes.  Pertinent labs & imaging results that were available during my care of the patient were reviewed by me and considered in my medical decision making (see chart for details).    Start NSAID as directed for pain and inflammation. Muscle relaxant as needed. Ice/heat compresses.  MiraLAX for constipation. Discussed with patient this can take up to 3-4 weeks to resolve, but should be getting better each week. Return precautions given.    Final Clinical Impressions(s) / UC Diagnoses   Final diagnoses:  Acute left-sided low back pain without sciatica    ED Discharge Orders        Ordered    meloxicam (MOBIC) 7.5 MG tablet  Daily     11/02/17 1935    cyclobenzaprine (FLEXERIL) 10 MG tablet  At bedtime PRN     11/02/17 1935    polyethylene glycol (MIRALAX) packet  Daily     11/02/17 1939        Belinda Fisher, PA-C 11/02/17 1941

## 2017-11-02 NOTE — Discharge Instructions (Signed)
Start Mobic as directed. Flexeril as needed at night. Flexeril can make you drowsy, so do not take if you are going to drive, operate heavy machinery, or make important decisions. Ice/heat compresses as needed. This can take up to 3-4 weeks to completely resolve, but you should be feeling better each week. Follow up with PCP/orthopedics if symptoms worsen, changes for reevaluation. If experience numbness/tingling of the inner thighs, loss of bladder or bowel control, go to the emergency department for evaluation.  ° °

## 2017-11-15 NOTE — Telephone Encounter (Signed)
Closed Encounter  °

## 2018-02-09 ENCOUNTER — Ambulatory Visit (HOSPITAL_COMMUNITY)
Admission: EM | Admit: 2018-02-09 | Discharge: 2018-02-09 | Disposition: A | Discharge status: Home / Self Care | Payer: Self-pay | Attending: Family Medicine | Admitting: Family Medicine

## 2018-02-09 ENCOUNTER — Encounter (HOSPITAL_COMMUNITY): Payer: Self-pay

## 2018-02-09 DIAGNOSIS — M7661 Achilles tendinitis, right leg: Secondary | ICD-10-CM

## 2018-02-09 DIAGNOSIS — G5601 Carpal tunnel syndrome, right upper limb: Secondary | ICD-10-CM

## 2018-02-09 MED ORDER — PREDNISONE 10 MG (21) PO TBPK: ORAL_TABLET | ORAL | 0 refills | Status: DC

## 2018-02-09 NOTE — ED Provider Notes (Signed)
[facility]    CSN: [id number] Arrival date & time: 02/09/18  1022     History   Chief Complaint Chief Complaint  Patient presents with  . Foot Pain    HPI Vincent Mcpherson is a 37 y.o. male.   HPI  Here for evaluation of right ankle pain He works at a [employer].  He is on his feet for the whole shift.  He states it does not get a break.  He has been doing this return half years.  He denies any slip trip or fall.  He always wears work shoes.  He feels like they fit well.  No athletic activity outside of work.  He has had gradual onset of right ankle pain over weeks.  Over the last couple days it is gotten so painful that he can hardly walk.  No prior foot or ankle problems in the past. While he is here he would like to mention right hand numbness.  He states he wakes up in the morning with his hand numb and has to "shake it out".  He uses his hands a lot at the work, with continuous movement all day long.  He is left-handed.  He is not diabetic.  He does have a prior injury to his index finger but this is separate from the tingling he gets in his fingers.  He had a laceration to the index finger of his right hand that left him with numbness from the laceration to the tip.  Past Medical History:  Diagnosis Date  . Atrial fibrillation (HCC)   . Hypertension   . Palpitations   . Syncope 05/04/12   while visiting another pt at [facility]  . Varicose vein     Patient Active Problem List   Diagnosis Date Noted  . Atrial fibrillation with RVR (HCC) 09/01/2017  . Atrial fibrillation (HCC) 09/03/2012  . Syncope 09/03/2012  . OSA (obstructive sleep apnea) 09/03/2012    History reviewed. No pertinent surgical history.     Home Medications    Prior to Admission medications   Medication Sig Start Date End Date Taking? Authorizing Provider  albuterol (PROVENTIL HFA;VENTOLIN HFA) 108 (90 Base) MCG/ACT inhaler Inhale 1-2 puffs into the lungs every 6 (six) hours as  needed for wheezing or shortness of breath. 03/20/17   Deatra Canter, FNP  diltiazem (CARDIZEM) 30 MG tablet Take 1 table PRN every 6 hours for heart rate above 115 bpm. 09/02/17   Bhagat, Bhavinkumar, PA  polyethylene glycol (MIRALAX) packet Take 17 g by mouth daily. 11/02/17   Belinda Fisher, PA-C  predniSONE (STERAPRED UNI-PAK 21 TAB) 10 MG (21) TBPK tablet Take as directed 02/09/18   Eustace Moore, MD    Family History Family History  Problem Relation Age of Onset  . Atrial fibrillation Mother     Social History Social History   Tobacco Use  . Smoking status: Current Every Day Smoker    Packs/day: 1.00  . Smokeless tobacco: Never Used  Substance Use Topics  . Alcohol use: Yes  . Drug use: Yes    Types: Marijuana, Cocaine    Comment: Pt states cocaine use is recreational     Allergies   Penicillins   Review of Systems Review of Systems  Constitutional: Negative for chills and fever.  HENT: Negative for ear pain and sore throat.   Eyes: Negative for pain and visual disturbance.  Respiratory: Negative for cough and shortness of breath.  Cardiovascular: Negative for chest pain and palpitations.  Gastrointestinal: Negative for abdominal pain and vomiting.  Genitourinary: Negative for dysuria and hematuria.  Musculoskeletal: Positive for arthralgias. Negative for back pain.  Skin: Negative for color change and rash.  Neurological: Positive for numbness. Negative for seizures and syncope.  All other systems reviewed and are negative.    Physical Exam Triage Vital Signs ED Triage Vitals [02/09/18 1103]  Enc Vitals Group     BP 133/87     Pulse Rate 92     Resp 18     Temp 97.7 F (36.5 C)     Temp src      SpO2 99 %     Weight      Height      Head Circumference      Peak Flow      Pain Score 9     Pain Loc      Pain Edu?      Excl. in GC?    No data found.  Updated Vital Signs BP 133/87   Pulse 92   Temp 97.7 F (36.5 C)   Resp 18   SpO2 99%    Visual Acuity Right Eye Distance:   Left Eye Distance:   Bilateral Distance:    Right Eye Near:   Left Eye Near:    Bilateral Near:     Physical Exam  Constitutional: He appears well-developed and well-nourished. No distress.  HENT:  Head: Normocephalic and atraumatic.  Mouth/Throat: Oropharynx is clear and moist.  Eyes: Pupils are equal, round, and reactive to light. Conjunctivae are normal.  Neck: Normal range of motion.  Cardiovascular: Normal rate.  Pulmonary/Chest: Effort normal. No respiratory distress.  Abdominal: He exhibits no distension.  Musculoskeletal:  Right hand is examined.  Well-healed scar over the proximal phalanx, lateral index finger, right side.  There is anesthesia to the skin distally.  It appears he had a digital nerve laceration.  Tinel's is negative.  Phalen's is positive.  There is no thenar atrophy.  Good grip strength. Right ankle has mild soft tissue swelling.  There is no tenderness over the Achilles tendon.  Patient can stand on his heels and toes but with difficulty.  The tenderness was posterior to the Achilles tendon at the insertion of the heel consistent with bursitis.  Neurological: He is alert.  Skin: Skin is warm and dry.     UC Treatments / Results  Labs (all labs ordered are listed, but only abnormal results are displayed) Labs Reviewed - No data to display  EKG None  Radiology No results found.  Procedures Procedures (including critical care time)  Medications Ordered in UC Medications - No data to display  Initial Impression / Assessment and Plan / UC Course  I have reviewed the triage vital signs and the nursing notes.  Pertinent labs & imaging results that were available during my care of the patient were reviewed by me and considered in my medical decision making (see chart for details).     Discussed that the carpal tunnel syndrome may be a work-related illness because of his repetitive motion.  It is in his  nondominant hand although he uses his right hand extensively at work.  I explained that if he wishes to claim this is Worker's Comp. he needs to first make an excellent report at his employer. The Achilles bursitis is not likely to be a work disorder.  He may need different shoe wear.  We discussed getting  a heel lift.  For now he will get ice, anti-inflammatories, and stretching with referral to orthopedic if he fails to improve Did give him a brace out of our free box to wear at night for the right hand, carpal tunnel symptomatic relief Final Clinical Impressions(s) / UC Diagnoses   Final diagnoses:  Right Achilles bursitis  Carpal tunnel syndrome, right     Discharge Instructions     Use ice for 20 minutes every couple of hours to the area behind your heel Take the prednisone medication as directed.  After prednisone take ibuprofen 800 mg 3 times a day with food Stay off your feet as much as possible for the next 2 to 3 days.  Walk only as much as you need to to get food and get to the bathroom. Gently stretch your foot and ankle up and down several times a day Wear the carpal tunnel brace at night to prevent numbness You may want to discuss with your employer whether your carpal tunnel syndrome is work related return if not better in a few days.  You may need an orthopedic     ED Prescriptions    Medication Sig Dispense Auth. Provider   predniSONE (STERAPRED UNI-PAK 21 TAB) 10 MG (21) TBPK tablet Take as directed 21 tablet Eustace Moore, MD     Controlled Substance Prescriptions [place] Controlled Substance Registry consulted? Not Applicable   Eustace Moore, MD 02/09/18 [phone]

## 2018-02-09 NOTE — Discharge Instructions (Addendum)
Use ice for 20 minutes every couple of hours to the area behind your heel Take the prednisone medication as directed.  After prednisone take ibuprofen 800 mg 3 times a day with food Stay off your feet as much as possible for the next 2 to 3 days.  Walk only as much as you need to to get food and get to the bathroom. Gently stretch your foot and ankle up and down several times a day Wear the carpal tunnel brace at night to prevent numbness You may want to discuss with your employer whether your carpal tunnel syndrome is work related return if not better in a few days.  You may need an orthopedic

## 2018-02-09 NOTE — ED Triage Notes (Addendum)
Pt presents with complaints of right foot pain x 1 week, denies any injuries. Pt also endorses numbness in right hand x 1 week.

## 2018-02-20 ENCOUNTER — Ambulatory Visit (HOSPITAL_COMMUNITY)
Admission: EM | Admit: 2018-02-20 | Discharge: 2018-02-20 | Disposition: A | Discharge status: Home / Self Care | Payer: Self-pay | Attending: Emergency Medicine | Admitting: Emergency Medicine

## 2018-02-20 ENCOUNTER — Other Ambulatory Visit: Payer: Self-pay

## 2018-02-20 ENCOUNTER — Encounter (HOSPITAL_COMMUNITY): Payer: Self-pay | Admitting: Emergency Medicine

## 2018-02-20 DIAGNOSIS — F419 Anxiety disorder, unspecified: Secondary | ICD-10-CM

## 2018-02-20 MED ORDER — HYDROXYZINE HCL 50 MG PO TABS: 50.0000 mg | ORAL_TABLET | Freq: Four times a day (QID) | ORAL | 0 refills | Status: AC

## 2018-02-20 NOTE — ED Provider Notes (Signed)
[facility]    CSN: [id number] Arrival date & time: 02/20/18  1929     History   Chief Complaint Chief Complaint  Patient presents with  . Anxiety    HPI Vincent Mcpherson is a 37 y.o. male.   37 year old male with history of alcohol induced A. Fib, hypertension, palpitation, varicose veins comes in for anxiety attack.  States on his way to work, he felt short of breath, and started having hyperventilation, palpitation, dizziness.  States he felt confined to his car with the heat and felt that  that might of triggered the symptoms. Denies syncope. Denies any increase in stress.  He drinks 4-5 beers a day.  No caffeine use.  Current everyday smoker, 0.5 packs/day.  Daily THC use.  Denies other illicit drug use.  Denies recent long travels.  Does have varicose veins in the right calf, denies increased swelling, erythema, increased warmth. Denies URI symptoms such as cough, congestion, sore throat. Denies fever, chills, night sweats.     Past Medical History:  Diagnosis Date  . Atrial fibrillation (HCC)   . Hypertension   . Palpitations   . [facility]12   [facility]  . Varicose vein     Patient Active Problem List   Diagnosis Date Noted  . Atrial fibrillation with RVR (HCC) 09/01/2017  . Atrial fibrillation (HCC) 09/03/2012  . Syncope 09/03/2012  . OSA (obstructive sleep apnea) 09/03/2012    History reviewed. No pertinent surgical history.     Home Medications    Prior to Admission medications   Medication Sig Start Date End Date Taking? Authorizing Provider  hydrOXYzine (ATARAX/VISTARIL) 50 MG tablet Take 1 tablet (50 mg total) by mouth every 6 (six) hours for 15 days. 02/20/18 03/07/18  Belinda FisherYu, Oluwaferanmi Wain V, PA-C    Family History Family History  Problem Relation Age of Onset  . Atrial fibrillation Mother     Social History Social History   Tobacco Use  . Smoking status: Current Every Day Smoker    Packs/day: 1.00  . Smokeless  tobacco: Never Used  Substance Use Topics  . Alcohol use: Yes  . Drug use: Yes    Types: Marijuana, Cocaine    Comment: Pt states cocaine use is recreational     Allergies   Penicillins   Review of Systems Review of Systems  Reason unable to perform ROS: See HPI as above.     Physical Exam Triage Vital Signs ED Triage Vitals  Enc Vitals Group     BP 02/20/18 1935 122/80     Pulse Rate 02/20/18 1935 93     Resp 02/20/18 1935 20     Temp 02/20/18 1935 98.2 F (36.8 C)     Temp Source 02/20/18 1935 Oral     SpO2 02/20/18 1935 98 %     Weight --      Height --      Head Circumference --      Peak Flow --      Pain Score 02/20/18 1936 0     Pain Loc --      Pain Edu? --      Excl. in GC? --    No data found.  Updated Vital Signs BP 122/80 (BP Location: Right Arm)   Pulse 93   Temp 98.2 F (36.8 C) (Oral)   Resp 20   SpO2 98%   Physical Exam  Constitutional: He is oriented to person, place, and time.  He appears well-developed and well-nourished. No distress.  Talking in full sentences.   Neck: Normal range of motion. Neck supple.  Cardiovascular: Normal rate, regular rhythm, normal heart sounds, intact distal pulses and normal pulses. Exam reveals no gallop, no friction rub and no decreased pulses.  No murmur heard. Pulmonary/Chest: Effort normal and breath sounds normal. No accessory muscle usage or stridor. No respiratory distress. He has no decreased breath sounds. He has no wheezes. He has no rhonchi. He has no rales.  Musculoskeletal:  Right leg varicose veins.  Without tenderness to palpation.  Calf without swelling, erythema, increased warmth.  Neurological: He is alert and oriented to person, place, and time. He is not disoriented. Coordination and gait normal. GCS eye subscore is 4. GCS verbal subscore is 5. GCS motor subscore is 6.  Skin: He is not diaphoretic.     UC Treatments / Results  Labs (all labs ordered are listed, but only abnormal  results are displayed) Labs Reviewed - No data to display  EKG None  Radiology No results found.  Procedures Procedures (including critical care time)  Medications Ordered in UC Medications - No data to display  Initial Impression / Assessment and Plan / UC Course  I have reviewed the triage vital signs and the nursing notes.  Pertinent labs & imaging results that were available during my care of the patient were reviewed by me and considered in my medical decision making (see chart for details).    Patient would like to try hydroxyzine as it had been mentioned in the past.  No alarming signs on exam, normal vitals with a long travels, low suspicion for PE.  Patient with regular rate and rhythm today.  Discussed other causes of anxiety/palpitation symptoms including alcohol use, drug use, smoking.  Will have patient follow-up with PCP for further evaluation and management needed.  Return precautions given.  Patient states never followed up with cardiology for further evaluation after admission for A. fib 6 months ago.  Will provide information for cardiology for follow-up and evaluation needed.  Patient expresses understanding and agrees to plan.  Final Clinical Impressions(s) / UC Diagnoses   Final diagnoses:  Anxiety    ED Prescriptions    Medication Sig Dispense Auth. Provider   hydrOXYzine (ATARAX/VISTARIL) 50 MG tablet Take 1 tablet (50 mg total) by mouth every 6 (six) hours for 15 days. 60 tablet Threasa Alpha, [place] 02/20/18 2020

## 2018-02-20 NOTE — Discharge Instructions (Addendum)
No alarming signs today. Start hydroxyzine as directed. Follow up with PCP/psychiatry for further evaluation and management needed. Monitor for chest pain, worsening shortness of breath, palpitations, weakness, dizziness, one sided leg swelling, go to the emergency department for further evaluation.   Given your history, I would like you to follow up with cardiology for evaluation of afib.

## 2018-02-20 NOTE — ED Triage Notes (Signed)
[facility] a [facility] a panic attack started on his way to work today. The patient reported a hx of anxiety.

## 2018-06-19 ENCOUNTER — Other Ambulatory Visit: Payer: Self-pay

## 2018-06-19 ENCOUNTER — Ambulatory Visit (HOSPITAL_COMMUNITY)
Admission: EM | Admit: 2018-06-19 | Discharge: 2018-06-19 | Disposition: A | Discharge status: Home / Self Care | Payer: Self-pay | Attending: Family Medicine | Admitting: Family Medicine

## 2018-06-19 ENCOUNTER — Encounter (HOSPITAL_COMMUNITY): Payer: Self-pay | Admitting: *Deleted

## 2018-06-19 DIAGNOSIS — L02411 Cutaneous abscess of right axilla: Secondary | ICD-10-CM

## 2018-06-19 MED ORDER — SULFAMETHOXAZOLE-TRIMETHOPRIM 800-160 MG PO TABS: 1.0000 | ORAL_TABLET | Freq: Two times a day (BID) | ORAL | 0 refills | Status: AC

## 2018-06-19 NOTE — ED Provider Notes (Addendum)
[facility]    CSN: [id number] Arrival date & time: 06/19/18  1532     History   Chief Complaint Chief Complaint  Patient presents with  . Abscess    HPI Vincent Mcpherson is a 37 y.o. male.   Patient is a 37 year old male with abscess to right axilla x1 week.  The abscess is gotten larger and more painful.  Symptoms have been constant and worsening.  He has not done anything for his symptoms.  He denies any drainage from the abscess.  He denies any fever, chills, body aches, fatigue, night sweats.  He denies any history of abscesses or MRSA.  ROS per HPI      Past Medical History:  Diagnosis Date  . Atrial fibrillation (HCC)   . Hypertension   . Palpitations   . Syncope 05/04/12   while visiting another pt at [facility]  . Varicose vein     Patient Active Problem List   Diagnosis Date Noted  . Atrial fibrillation with RVR (HCC) 09/01/2017  . Atrial fibrillation (HCC) 09/03/2012  . Syncope 09/03/2012  . OSA (obstructive sleep apnea) 09/03/2012    Past Surgical History:  Procedure Laterality Date  . APPENDECTOMY         Home Medications    Prior to Admission medications   Medication Sig Start Date End Date Taking? Authorizing Provider  sulfamethoxazole-trimethoprim (BACTRIM DS,SEPTRA DS) 800-160 MG tablet Take 1 tablet by mouth 2 (two) times daily for 7 days. 06/19/18 06/26/18  Janace Aris, NP    Family History Family History  Problem Relation Age of Onset  . Atrial fibrillation Mother     Social History Social History   Tobacco Use  . Smoking status: Current Every Day Smoker    Packs/day: 1.00  . Smokeless tobacco: Never Used  Substance Use Topics  . Alcohol use: Yes  . Drug use: Yes    Types: Marijuana, Cocaine    Comment: Pt states cocaine use is recreational     Allergies   Penicillins   Review of Systems Review of Systems   Physical Exam Triage Vital Signs ED Triage Vitals  Enc Vitals Group     BP 06/19/18 1601 (!)  141/88     Pulse Rate 06/19/18 1601 78     Resp 06/19/18 1601 18     Temp 06/19/18 1601 98.1 F (36.7 C)     Temp Source 06/19/18 1601 Oral     SpO2 06/19/18 1601 95 %     Weight --      Height --      Head Circumference --      Peak Flow --      Pain Score 06/19/18 1603 10     Pain Loc --      Pain Edu? --      Excl. in GC? --    No data found.  Updated Vital Signs BP (!) 141/88 (BP Location: Left Arm)   Pulse 78   Temp 98.1 F (36.7 C) (Oral)   Resp 18   SpO2 95%   Visual Acuity Right Eye Distance:   Left Eye Distance:   Bilateral Distance:    Right Eye Near:   Left Eye Near:    Bilateral Near:     Physical Exam  Constitutional: He appears well-developed and well-nourished.  Very pleasant. Non toxic or ill appearing.     HENT:  Head: Normocephalic and atraumatic.  Eyes: Conjunctivae are normal.  Neck: Normal range  of motion.  Pulmonary/Chest: Effort normal.  Musculoskeletal: Normal range of motion.  Neurological: He is alert.  Skin: Skin is warm and dry. Capillary refill takes less than 2 seconds.  3 cm x 2 cm abscess to right axilla with 1 cm of fluctuance and surrounding induration and erythema.  Mildly tender to touch.  No obvious drainage  Nursing note and vitals reviewed.    UC Treatments / Results  Labs (all labs ordered are listed, but only abnormal results are displayed) Labs Reviewed - No data to display  EKG None  Radiology No results found.  Procedures Incision and Drainage Date/Time: 06/19/2018 5:01 PM Performed by: Janace Aris, NP Authorized by: Janace Aris, NP   Consent:    Consent obtained:  Verbal   Consent given by:  Patient   Risks discussed:  Bleeding, incomplete drainage and pain   Alternatives discussed:  No treatment Location:    Type:  Abscess   Location:  Upper extremity   Upper extremity location:  Arm   Arm location:  R upper arm (axilla) Pre-procedure details:    Skin preparation:  Betadine Anesthesia  (see MAR for exact dosages):    Anesthesia method:  Local infiltration   Local anesthetic:  Lidocaine 2% w/o epi Procedure type:    Complexity:  Simple Procedure details:    Needle aspiration: no     Incision types:  Single straight   Incision depth:  Subcutaneous   Scalpel blade:  11   Wound management:  Probed and deloculated   Drainage:  Purulent   Drainage amount:  Moderate   Wound treatment:  Wound left open   Packing materials:  None Post-procedure details:    Patient tolerance of procedure:  Tolerated well, no immediate complications   (including critical care time)  Medications Ordered in UC Medications - No data to display  Initial Impression / Assessment and Plan / UC Course  I have reviewed the triage vital signs and the nursing notes.  Pertinent labs & imaging results that were available during my care of the patient were reviewed by me and considered in my medical decision making (see chart for details).     Incision and drainage of abscess.  Patient tolerated well Moderate amount of purulent drainage expressed Dressed abscess. Bactrim for antibiotic coverage Continue the warm compresses Follow up as needed for continued or worsening symptoms  Final Clinical Impressions(s) / UC Diagnoses   Final diagnoses:  Abscess of axilla, right     Discharge Instructions     It was nice meeting you!!  We drain the abscess with success. We will go ahead and treat with antibiotics for 7 days Follow-up as needed for continued or worsening symptoms Continue the warm compresses to promote more drainage.    ED Prescriptions    Medication Sig Dispense Auth. Provider   sulfamethoxazole-trimethoprim (BACTRIM DS,SEPTRA DS) 800-160 MG tablet Take 1 tablet by mouth 2 (two) times daily for 7 days. 14 tablet Dahlia Byes A, NP     Controlled Substance Prescriptions [place] Controlled Substance Registry consulted? Not Applicable   Janace Aris, NP 06/19/18 1653    Dahlia Byes A, NP 06/19/18 1702

## 2018-06-19 NOTE — Discharge Instructions (Addendum)
It was nice meeting you!!  We drain the abscess with success. We will go ahead and treat with antibiotics for 7 days Follow-up as needed for continued or worsening symptoms Continue the warm compresses to promote more drainage.

## 2018-06-19 NOTE — ED Triage Notes (Signed)
Abscess under right arm onset last week.

## 2018-11-28 ENCOUNTER — Ambulatory Visit (HOSPITAL_COMMUNITY)
Admission: EM | Admit: 2018-11-28 | Discharge: 2018-11-28 | Disposition: A | Discharge status: Home / Self Care | Payer: Self-pay | Attending: Family Medicine | Admitting: Family Medicine

## 2018-11-28 ENCOUNTER — Encounter (HOSPITAL_COMMUNITY): Payer: Self-pay | Admitting: Emergency Medicine

## 2018-11-28 DIAGNOSIS — H6123 Impacted cerumen, bilateral: Secondary | ICD-10-CM

## 2018-11-28 NOTE — ED Triage Notes (Signed)
Pt c/o problems hearing in bilateral ears x3 days.

## 2018-12-03 NOTE — ED Provider Notes (Signed)
[facility] CARE CENTER   [id number] 11/28/18 Arrival Time: 1534  ASSESSMENT & PLAN:  1. Bilateral impacted cerumen    Much improvement after cerumen removal. Majority of cerumen manually removed by me. Flushing required for the rest.  May f/u with PCP or here as needed.  Reviewed expectations re: course of current medical issues. Questions answered. Outlined signs and symptoms indicating need for more acute intervention. Patient verbalized understanding. After Visit Summary given.   SUBJECTIVE: History from: patient.  Vincent Mcpherson is a 38 y.o. male who presents with complaint of bilateral "muffled hearing"; without drainage or pain; without bleeding. Onset gradual, several days ago. Recent cold symptoms: none. Fever: no. Overall normal PO intake without n/v. Sick contacts: no. OTC treatment: ear drops without relief; h/o similar in the past secondary to cerumen build up.  Social History   Tobacco Use  Smoking Status Current Every Day Smoker  . Packs/day: 1.00  Smokeless Tobacco Never Used    ROS: As per HPI.   OBJECTIVE:  Vitals:   11/28/18 1621  BP: 114/73  Pulse: 83  Resp: 16  Temp: 98 F (36.7 C)  SpO2: 100%     General appearance: alert; appears fatigued Ear Canal: cerumen bilaterally (mildly erythematous after cerumen removal) TM: bilateral: appear normal (after cerumen removal) Neck: supple without LAD Lungs: unlabored respirations, symmetrical air entry; no respiratory distress Skin: warm and dry Psychological: alert and cooperative; normal mood and affect  Allergies  Allergen Reactions  . Penicillins Rash    Has patient had a PCN reaction causing immediate rash, facial/tongue/throat swelling, SOB or lightheadedness with hypotension: Yes Has patient had a PCN reaction causing severe rash involving mucus membranes or skin necrosis: Yes Has patient had a PCN reaction that required hospitalization: Yes Has patient had a PCN reaction occurring  within the last 10 years: No If all of the above answers are "NO", then may proceed with Cephalosporin use.    Past Medical History:  Diagnosis Date  . Atrial fibrillation (HCC)   . Hypertension   . Palpitations   . Syncope 05/04/12   while visiting another pt at [facility]  . Varicose vein    Family History  Problem Relation Age of Onset  . Atrial fibrillation Mother    Social History   Socioeconomic History  . Marital status: Single    Spouse name: Not on file  . Number of children: Not on file  . Years of education: Not on file  . Highest education level: Not on file  Occupational History  . Not on file  Social Needs  . Financial resource strain: Not on file  . Food insecurity:    Worry: Not on file    Inability: Not on file  . Transportation needs:    Medical: Not on file    Non-medical: Not on file  Tobacco Use  . Smoking status: Current Every Day Smoker    Packs/day: 1.00  . Smokeless tobacco: Never Used  Substance and Sexual Activity  . Alcohol use: Yes  . Drug use: Yes    Types: Marijuana, Cocaine    Comment: Pt states cocaine use is recreational  . Sexual activity: Not on file  Lifestyle  . Physical activity:    Days per week: Not on file    Minutes per session: Not on file  . Stress: Not on file  Relationships  . Social connections:    Talks on phone: Not on file    Gets together: Not on file  Attends religious service: Not on file    Active member of club or organization: Not on file    Attends meetings of clubs or organizations: Not on file    Relationship status: Not on file  . Intimate partner violence:    Fear of current or ex partner: Not on file    Emotionally abused: Not on file    Physically abused: Not on file    Forced sexual activity: Not on file  Other Topics Concern  . Not on file  Social History Narrative  . Not on file            Mardella Layman, MD 12/05/18 [phone]

## 2018-12-29 ENCOUNTER — Encounter (HOSPITAL_COMMUNITY): Payer: Self-pay | Admitting: Family Medicine

## 2018-12-29 ENCOUNTER — Ambulatory Visit (HOSPITAL_COMMUNITY)
Admission: EM | Admit: 2018-12-29 | Discharge: 2018-12-29 | Disposition: A | Discharge status: Home / Self Care | Payer: Self-pay | Attending: Family Medicine | Admitting: Family Medicine

## 2018-12-29 ENCOUNTER — Other Ambulatory Visit: Payer: Self-pay

## 2018-12-29 DIAGNOSIS — R0789 Other chest pain: Secondary | ICD-10-CM

## 2018-12-29 MED ORDER — PREDNISONE 50 MG PO TABS: ORAL_TABLET | ORAL | 0 refills | Status: DC

## 2018-12-29 MED ORDER — CYCLOBENZAPRINE HCL 5 MG PO TABS: 5.0000 mg | ORAL_TABLET | Freq: Every day | ORAL | 0 refills | Status: DC

## 2018-12-29 NOTE — ED Provider Notes (Signed)
[facility]    CSN: [id number] Arrival date & time: 12/29/18  1449     History   Chief Complaint Chief Complaint  Patient presents with  . Back Pain    HPI Vincent Mcpherson is a 38 y.o. male.   38 yo man who awoke with midline thoracic spine pain.  He just moved and has a new mattress.  No trauma hx.  He feels pain mid back that radiates to the front all day.  Slightly worse with deep breath or moving arms.  No prior hx.  No fever or cough.     Past Medical History:  Diagnosis Date  . Atrial fibrillation (HCC)   . Hypertension   . Palpitations   . Syncope 05/04/12   while visiting another pt at [facility]  . Varicose vein     Patient Active Problem List   Diagnosis Date Noted  . Atrial fibrillation with RVR (HCC) 09/01/2017  . Atrial fibrillation (HCC) 09/03/2012  . Syncope 09/03/2012  . OSA (obstructive sleep apnea) 09/03/2012    Past Surgical History:  Procedure Laterality Date  . APPENDECTOMY         Home Medications    Prior to Admission medications   Medication Sig Start Date End Date Taking? Authorizing Provider  cyclobenzaprine (FLEXERIL) 5 MG tablet Take 1 tablet (5 mg total) by mouth at bedtime. 12/29/18   Elvina Sidle, MD  predniSONE (DELTASONE) 50 MG tablet One daily 12/29/18   Elvina Sidle, MD    Family History Family History  Problem Relation Age of Onset  . Atrial fibrillation Mother     Social History Social History   Tobacco Use  . Smoking status: Current Every Day Smoker    Packs/day: 1.00  . Smokeless tobacco: Never Used  Substance Use Topics  . Alcohol use: Yes  . Drug use: Yes    Types: Marijuana, Cocaine    Comment: Pt states cocaine use is recreational     Allergies   Penicillins   Review of Systems Review of Systems  Respiratory: Negative.   Musculoskeletal: Positive for back pain.  All other systems reviewed and are negative.    Physical Exam Triage Vital Signs ED Triage Vitals  Enc Vitals  Group     BP 12/29/18 1521 (!) 148/99     Pulse Rate 12/29/18 1521 77     Resp 12/29/18 1521 18     Temp 12/29/18 1521 (!) 97.5 F (36.4 C)     Temp Source 12/29/18 1521 Oral     SpO2 12/29/18 1521 98 %     Weight --      Height --      Head Circumference --      Peak Flow --      Pain Score 12/29/18 1520 7     Pain Loc --      Pain Edu? --      Excl. in GC? --    No data found.  Updated Vital Signs BP (!) 148/99 (BP Location: Right Arm)   Pulse 77   Temp (!) 97.5 F (36.4 C) (Oral)   Resp 18   SpO2 98%    Physical Exam Vitals signs and nursing note reviewed.  Constitutional:      Appearance: Normal appearance. He is obese.  HENT:     Head: Normocephalic.  Neck:     Musculoskeletal: Normal range of motion and neck supple.  Cardiovascular:     Rate and Rhythm: Normal rate  and regular rhythm.     Pulses: Normal pulses.     Heart sounds: Normal heart sounds.  Pulmonary:     Effort: Pulmonary effort is normal.     Breath sounds: Normal breath sounds.  Musculoskeletal: Normal range of motion.     Comments: Slightly tender T6 area with palpation    Skin:    General: Skin is warm.  Neurological:     General: No focal deficit present.     Mental Status: He is alert.  Psychiatric:        Mood and Affect: Mood normal.      UC Treatments / Results  Labs (all labs ordered are listed, but only abnormal results are displayed) Labs Reviewed - No data to display  EKG None  Radiology No results found.  Procedures Procedures (including critical care time)  Medications Ordered in UC Medications - No data to display  Initial Impression / Assessment and Plan / UC Course  I have reviewed the triage vital signs and the nursing notes.  Pertinent labs & imaging results that were available during my care of the patient were reviewed by me and considered in my medical decision making (see chart for details).    Final Clinical Impressions(s) / UC Diagnoses    Final diagnoses:  Chest wall pain   Discharge Instructions   None    ED Prescriptions    Medication Sig Dispense Auth. Provider   predniSONE (DELTASONE) 50 MG tablet One daily 5 tablet Elvina SidleLauenstein, Cordelia Bessinger, MD   cyclobenzaprine (FLEXERIL) 5 MG tablet Take 1 tablet (5 mg total) by mouth at bedtime. 7 tablet Elvina SidleLauenstein, Kenshin Splawn, MD     Controlled Substance Prescriptions [place] Controlled Substance Registry consulted? Not Applicable   Elvina SidleLauenstein, Reyonna Haack, MD 12/29/18 1553

## 2018-12-29 NOTE — ED Triage Notes (Addendum)
Per pt he is having back pain that feels like is coming through his chest. Pt said he moved and his bed is laying on the floor now and pt said when he woke up the next morning he felt like he had pulled something. NO SOB but when he lays down he feels like he has pulled a muscle and makes him very uncomfortable to lay in one position. When touched down his upper lumbar are he says that make sit feel better. When he sleeps on his back that makes him feel better too.

## 2020-10-23 ENCOUNTER — Emergency Department (HOSPITAL_COMMUNITY): Payer: 59

## 2020-10-23 ENCOUNTER — Encounter (HOSPITAL_COMMUNITY): Payer: Self-pay

## 2020-10-23 ENCOUNTER — Emergency Department (HOSPITAL_COMMUNITY)
Admission: EM | Admit: 2020-10-23 | Discharge: 2020-10-26 | Disposition: A | Discharge status: Other Acute Inpatient Hospital | Payer: 59 | Source: Home / Self Care | Attending: Emergency Medicine | Admitting: Emergency Medicine

## 2020-10-23 ENCOUNTER — Other Ambulatory Visit: Payer: Self-pay

## 2020-10-23 DIAGNOSIS — Z046 Encounter for general psychiatric examination, requested by authority: Secondary | ICD-10-CM | POA: Insufficient documentation

## 2020-10-23 DIAGNOSIS — R462 Strange and inexplicable behavior: Secondary | ICD-10-CM | POA: Insufficient documentation

## 2020-10-23 DIAGNOSIS — I10 Essential (primary) hypertension: Secondary | ICD-10-CM | POA: Insufficient documentation

## 2020-10-23 DIAGNOSIS — R509 Fever, unspecified: Secondary | ICD-10-CM | POA: Insufficient documentation

## 2020-10-23 DIAGNOSIS — Z20822 Contact with and (suspected) exposure to covid-19: Secondary | ICD-10-CM | POA: Insufficient documentation

## 2020-10-23 DIAGNOSIS — R519 Headache, unspecified: Secondary | ICD-10-CM | POA: Insufficient documentation

## 2020-10-23 LAB — CBC
HCT: 40.2 % (ref 39.0–52.0)
Hemoglobin: 12.8 g/dL — ABNORMAL LOW (ref 13.0–17.0)
MCH: 30.7 pg (ref 26.0–34.0)
MCHC: 31.8 g/dL (ref 30.0–36.0)
MCV: 96.4 fL (ref 80.0–100.0)
Platelets: 225 10*3/uL (ref 150–400)
RBC: 4.17 MIL/uL — ABNORMAL LOW (ref 4.22–5.81)
RDW: 12.6 % (ref 11.5–15.5)
WBC: 7.7 10*3/uL (ref 4.0–10.5)
nRBC: 0 % (ref 0.0–0.2)

## 2020-10-23 LAB — COMPREHENSIVE METABOLIC PANEL
ALT: 27 U/L (ref 0–44)
AST: 31 U/L (ref 15–41)
Albumin: 4.3 g/dL (ref 3.5–5.0)
Alkaline Phosphatase: 42 U/L (ref 38–126)
Anion gap: 11 (ref 5–15)
BUN: 12 mg/dL (ref 6–20)
CO2: 24 mmol/L (ref 22–32)
Calcium: 9.2 mg/dL (ref 8.9–10.3)
Chloride: 98 mmol/L (ref 98–111)
Creatinine, Ser: 1.06 mg/dL (ref 0.61–1.24)
GFR, Estimated: >60 mL/min (ref >60)
Glucose, Bld: 108 mg/dL — ABNORMAL HIGH (ref 70–99)
Potassium: 4.1 mmol/L (ref 3.5–5.1)
Sodium: 133 mmol/L — ABNORMAL LOW (ref 135–145)
Total Bilirubin: 0.8 mg/dL (ref 0.3–1.2)
Total Protein: 7.4 g/dL (ref 6.5–8.1)

## 2020-10-23 LAB — SALICYLATE LEVEL: Salicylate Lvl: <7 mg/dL — ABNORMAL LOW (ref 7.0–30.0)

## 2020-10-23 LAB — RAPID URINE DRUG SCREEN, HOSP PERFORMED
Amphetamines: NOT DETECTED
Barbiturates: NOT DETECTED
Benzodiazepines: NOT DETECTED
Cocaine: NOT DETECTED
Opiates: NOT DETECTED
Tetrahydrocannabinol: POSITIVE — AB

## 2020-10-23 LAB — ETHANOL: Alcohol, Ethyl (B): <10 mg/dL (ref <10)

## 2020-10-23 LAB — RESP PANEL BY RT-PCR (FLU A&B, COVID) ARPGX2
Influenza A by PCR: NEGATIVE
Influenza B by PCR: NEGATIVE
SARS Coronavirus 2 by RT PCR: NEGATIVE

## 2020-10-23 LAB — ACETAMINOPHEN LEVEL: Acetaminophen (Tylenol), Serum: <10 ug/mL — ABNORMAL LOW (ref 10–30)

## 2020-10-23 MED ORDER — LORAZEPAM 2 MG/ML IJ SOLN: 0.0000 mg | Freq: Two times a day (BID) | INTRAMUSCULAR | Status: DC

## 2020-10-23 MED ORDER — ZIPRASIDONE MESYLATE 20 MG IM SOLR
20.0000 mg | Freq: Once | INTRAMUSCULAR | Status: AC
  Administered 2020-10-23: 20 mg via INTRAMUSCULAR

## 2020-10-23 MED ORDER — LORAZEPAM 1 MG PO TABS
0.0000 mg | ORAL_TABLET | Freq: Four times a day (QID) | ORAL | Status: AC
  Administered 2020-10-25: 1 mg via ORAL
  Filled 2020-10-23: qty 1

## 2020-10-23 MED ORDER — LORAZEPAM 1 MG PO TABS: 0.0000 mg | ORAL_TABLET | Freq: Two times a day (BID) | ORAL | Status: DC

## 2020-10-23 MED ORDER — ZIPRASIDONE MESYLATE 20 MG IM SOLR
INTRAMUSCULAR | Status: AC
  Filled 2020-10-23: qty 20

## 2020-10-23 MED ORDER — LORAZEPAM 2 MG/ML IJ SOLN: 0.0000 mg | Freq: Four times a day (QID) | INTRAMUSCULAR | Status: AC

## 2020-10-23 MED ORDER — THIAMINE HCL 100 MG PO TABS
100.0000 mg | ORAL_TABLET | Freq: Every day | ORAL | Status: DC
  Administered 2020-10-25 – 2020-10-26 (×2): 100 mg via ORAL
  Filled 2020-10-23 (×3): qty 1

## 2020-10-23 MED ORDER — THIAMINE HCL 100 MG/ML IJ SOLN: 100.0000 mg | Freq: Every day | INTRAMUSCULAR | Status: DC

## 2020-10-23 MED ORDER — ACETAMINOPHEN 325 MG PO TABS: 650.0000 mg | ORAL_TABLET | ORAL | Status: DC | PRN

## 2020-10-23 MED ORDER — AMLODIPINE BESYLATE 5 MG PO TABS
5.0000 mg | ORAL_TABLET | Freq: Once | ORAL | Status: DC
  Filled 2020-10-23: qty 1

## 2020-10-23 MED ORDER — STERILE WATER FOR INJECTION IJ SOLN
INTRAMUSCULAR | Status: AC
  Filled 2020-10-23: qty 10

## 2020-10-23 MED ORDER — ONDANSETRON HCL 4 MG PO TABS
4.0000 mg | ORAL_TABLET | Freq: Three times a day (TID) | ORAL | Status: DC | PRN
  Administered 2020-10-26: 4 mg via ORAL
  Filled 2020-10-23: qty 1

## 2020-10-23 NOTE — ED Provider Notes (Addendum)
[facility]-EMERGENCY DEPT Provider Note   CSN: [id number] Arrival date & time: 10/23/20  1745     History Chief Complaint  Patient presents with  . Psychiatric Evaluation  . Fever    Vincent Mcpherson is a 40 y.o. male with a past medical history significant for paroxysmal A. fib not on any anticoagulants and hypertension who presents to the ED via [employer] due to erratic behavior.  Patient was found in the middle of traffic kneeling and praying.  [employer] spoke to patient's brother who notes patient has been acting in an abnormal manner for the past few days.  Patient called in a bomb threat at his job earlier in the week.  Patient admits to frequent alcohol consumption roughly 6-7 beers (16oz) daily.  He admits to drinking 1 16 ounce beer earlier today.  He also admits to marijuana use earlier today.  No other drugs.  Patient denies SI, HI, auditory/visual hallucinations.  He notes "the spirit moved him" caused him to kneel in the middle of traffic. No physical complaints.  Denies chest pain, shortness of breath, palpitations, abdominal pain, nausea, vomiting, diarrhea, fever, and headache.  No treatment prior to arrival.  Per [employer] patient's brother was bringing him to the hospital for a psychiatric evaluation; however, patient jump out of the car. [employer] also saw patient dive directly into a dumpster prior to arrival where he states he was looking for his room key at a hotel he didn't have a room at.  History obtained from patient and past medical records. No interpreter used during encounter.       Past Medical History:  Diagnosis Date  . Atrial fibrillation (HCC)   . Hypertension   . Palpitations   . Syncope 05/04/12   while visiting another pt at [facility]  . Varicose vein     Patient Active Problem List   Diagnosis Date Noted  . Atrial fibrillation with RVR (HCC) 09/01/2017  . Atrial fibrillation (HCC) 09/03/2012  . Syncope 09/03/2012  . OSA (obstructive sleep apnea)  09/03/2012    Past Surgical History:  Procedure Laterality Date  . APPENDECTOMY         Family History  Problem Relation Age of Onset  . Atrial fibrillation Mother     Social History   Tobacco Use  . Smoking status: Current Every Day Smoker    Packs/day: 1.00    Types: Cigarettes  . Smokeless tobacco: Never Used  Vaping Use  . Vaping Use: Never used  Substance Use Topics  . Alcohol use: Yes  . Drug use: Yes    Types: Marijuana    Home Medications Prior to Admission medications   Medication Sig Start Date End Date Taking? Authorizing Provider  cyclobenzaprine (FLEXERIL) 5 MG tablet Take 1 tablet (5 mg total) by mouth at bedtime. 12/29/18   Elvina Sidle, MD  predniSONE (DELTASONE) 50 MG tablet One daily 12/29/18   Elvina Sidle, MD    Allergies    Penicillins  Review of Systems   Review of Systems  Constitutional: Negative for chills and fever.  HENT: Negative for rhinorrhea and sore throat.   Eyes: Negative for visual disturbance.  Respiratory: Negative for shortness of breath.   Cardiovascular: Negative for chest pain and palpitations.  Gastrointestinal: Negative for abdominal pain.  Genitourinary: Negative for dysuria.  Musculoskeletal: Negative for myalgias.  Skin: Negative for color change and rash.  Neurological: Negative for dizziness and light-headedness.  Psychiatric/Behavioral: Positive for behavioral problems.  Physical Exam Updated Vital Signs BP (!) 173/115 (BP Location: Left Arm)   Pulse 98   Temp 98.8 F (37.1 C) (Oral)   Resp 20   Ht 5\' 11"  (1.803 m)   Wt 117.9 kg   SpO2 100%   BMI 36.26 kg/m   Physical Exam Vitals and nursing note reviewed.  Constitutional:      General: He is not in acute distress. HENT:     Head: Normocephalic.  Eyes:     Pupils: Pupils are equal, round, and reactive to light.  Cardiovascular:     Rate and Rhythm: Normal rate and regular rhythm.     Pulses: Normal pulses.     Heart sounds: Normal  heart sounds. No murmur heard. No friction rub. No gallop.   Pulmonary:     Effort: Pulmonary effort is normal.     Breath sounds: Normal breath sounds.  Abdominal:     General: Abdomen is flat. Bowel sounds are normal. There is no distension.     Palpations: Abdomen is soft.     Tenderness: There is no abdominal tenderness. There is no guarding or rebound.  Musculoskeletal:        General: Normal range of motion.     Cervical back: Neck supple.  Skin:    General: Skin is warm and dry.  Neurological:     General: No focal deficit present.  Psychiatric:        Thought Content: Thought content does not include homicidal or suicidal ideation.        Judgment: Judgment is impulsive.     ED Results / Procedures / Treatments   Labs (all labs ordered are listed, but only abnormal results are displayed) Labs Reviewed  COMPREHENSIVE METABOLIC PANEL - Abnormal; Notable for the following components:      Result Value   Sodium 133 (*)    Glucose, Bld 108 (*)    All other components within normal limits  CBC - Abnormal; Notable for the following components:   RBC 4.17 (*)    Hemoglobin 12.8 (*)    All other components within normal limits  RAPID URINE DRUG SCREEN, HOSP PERFORMED - Abnormal; Notable for the following components:   Tetrahydrocannabinol POSITIVE (*)    All other components within normal limits  ACETAMINOPHEN LEVEL - Abnormal; Notable for the following components:   Acetaminophen (Tylenol), Serum <10 (*)    All other components within normal limits  SALICYLATE LEVEL - Abnormal; Notable for the following components:   Salicylate Lvl <7.0 (*)    All other components within normal limits  RESP PANEL BY RT-PCR (FLU A&B, COVID) [id number]  ETHANOL    EKG EKG Interpretation  Date/Time:  Friday October 23 2020 18:42:58 EST Ventricular Rate:  101 PR Interval:    QRS Duration: 107 QT Interval:  365 QTC Calculation: 474 R Axis:   68 Text Interpretation: Sinus tachycardia  Probable left atrial enlargement 12 Lead; Mason-Likar baseline wander Confirmed by 03-30-1984 [phone]) on 10/23/2020 6:55:44 PM   Radiology CT Head Wo Contrast  Result Date: 10/23/2020 CLINICAL DATA:  Delirium EXAM: CT HEAD WITHOUT CONTRAST TECHNIQUE: Contiguous axial images were obtained from the base of the skull through the vertex without intravenous contrast. COMPARISON:  05/04/2012 FINDINGS: Brain: No acute intracranial abnormality. Specifically, no hemorrhage, hydrocephalus, mass lesion, acute infarction, or significant intracranial injury. Vascular: No hyperdense vessel or unexpected calcification. Skull: No acute calvarial abnormality. Sinuses/Orbits: Visualized paranasal sinuses and mastoids clear. Orbital soft tissues unremarkable. Other:  None IMPRESSION: Normal study. Electronically Signed   By: Charlett Nose M.D.   On: 10/23/2020 21:26    Procedures Procedures   Medications Ordered in ED Medications  amLODipine (NORVASC) tablet 5 mg (has no administration in time range)    ED Course  I have reviewed the triage vital signs and the nursing notes.  Pertinent labs & imaging results that were available during my care of the patient were reviewed by me and considered in my medical decision making (see chart for details).  Clinical Course as of 10/23/20 2130  Fri Oct 23, 2020  2106 Tetrahydrocannabinol(!): POSITIVE [CA]    Clinical Course User Index [CA] Jesusita Oka   MDM Rules/Calculators/A&P                         40 year old male presents to the ED due to erratic and abnormal behavior for the past few days.  Patient here voluntarily with [employer].  Denies HI, SI, auditory/visual hallucinations.  No previous psychiatric history.  No physical complaints at time of initial assessment.  Stable vitals.  Patient in no acute distress and non-ill-appearing.  Physical exam reassuring.  Medical clearance labs ordered.  EKG due to history of A. Fib. CT head due to first episode  of psychosis with no psychiatric history.  CBC reassuring with no leukocytosis and mild anemia with hemoglobin at 12.8.  Ethanol, acetaminophen, and salicylate level within normal limits.  Covid and influenza negative.  CMP significant for hyponatremia 133 and mild hyperglycemia at 108 with no anion gap.  Doubt DKA.  Normal renal function. Patient's BP still elevated. Amlodipine 5mg  given.  Low suspicion for hypertensive urgency/emergency.  Patient states he is not currently on any hypertensive medications.  UDS positive for THC, but otherwise unremarkable.  CT head personally reviewed which is negative for any acute abnormalities.  Patient has been medically cleared for TTS evaluation.  The patient has been placed in psychiatric observation due to the need to provide a safe environment for the patient while obtaining psychiatric consultation and evaluation, as well as ongoing medical and medication management to treat the patient's condition.  The patient HAS been placed under full IVC at this time.  9:53 PM patient attempted to leave ED. Given patient's psychosis, patient placed under IVC. First examination performed. IM Geodon given. Patient evaluated outside the waiting room prior to Geodon administration with security.  Final Clinical Impression(s) / ED Diagnoses Final diagnoses:  Bizarre behavior    Rx / DC Orders ED Discharge Orders    None       , PA-C 10/23/20 2130    2131 10/23/20 2157    2158, MD 10/23/20 2203

## 2020-10-23 NOTE — ED Notes (Signed)
Pt changed into burgundy scrub pants and blue scrub top and belongings collected.  Pt wanded by security.

## 2020-10-23 NOTE — ED Notes (Addendum)
2 bags of belongings placed in locker 29

## 2020-10-23 NOTE — ED Notes (Signed)
While obtaining v/s, it was noted that pt had had an episode of urinary incontinence. CT tech came to take pt for scans, which he was able to ambulate and did not need the bed.  Linens changed while pt in CT. PA aware.

## 2020-10-23 NOTE — ED Triage Notes (Addendum)
Patient brought in by [employer] x 2 officers. Patient was kneeling and praying in the street causing a traffic hazard. When [employer] arrived, patient [facility], but when [facility] asked, it was verified that he did not have a room. Patient then walked off and dove head first into a dumpster, telling [employer] he was looking for his ID.  Patient reported that he had been taking Ectasy and weed. Patient's brother reported that the patient had issues 4 days ago at his job at [employer]. Patient called a bomb threat into his job at [employer] the same day.  Patient denies SI/HI. Patient states he had alcohol and marijuana today.

## 2020-10-24 LAB — LIPID PANEL
Cholesterol: 176 mg/dL (ref 0–200)
HDL: 46 mg/dL (ref >40)
LDL Cholesterol: 113 mg/dL — ABNORMAL HIGH (ref 0–99)
Total CHOL/HDL Ratio: 3.8 RATIO
Triglycerides: 84 mg/dL (ref <150)
VLDL: 17 mg/dL (ref 0–40)

## 2020-10-24 MED ORDER — MENTHOL 3 MG MT LOZG
1.0000 | LOZENGE | OROMUCOSAL | Status: DC | PRN
  Administered 2020-10-24: 3 mg via ORAL
  Filled 2020-10-24: qty 9

## 2020-10-24 NOTE — BH Assessment (Signed)
Per Everardo Pacific, RN pt received injections, is currently sleeping and unable to engage in TTS assessment.   RN to contact clinician once pt is able to engage in assessment.    Redmond Pulling, MS, [facility], [facility] Triage Specialist [phone]

## 2020-10-24 NOTE — ED Notes (Addendum)
IVC paperwork faxed to [place] (TTS) at [phone].

## 2020-10-24 NOTE — Care Management (Signed)
Per Nehemiah Settle, patient meets criteria for inpatient hospitalization.  Per [facility], Kim there are no appropriate beds at [facility].  Per Nehemiah Settle, the patient will be referred to other facilities:    Writer referred patient to the following facilities.   [facility] Details Fax  [address] [place] [address]  Internal comment [facility] Details Fax  [address], [place] [address]  Internal comment [facility] Details Fax  [address] [place], [place] [address]  Internal comment [facility] Details Fax  [address], [place] [address]  Internal comment [facility] [place] [facility] Details [address] [place] [address]  Internal comment [facility] Details Fax  [address] [place], [place] [address]  Internal comment [facility] Details Fax  [phone]. [address] [place] [address]  Internal comment [facility] Details Fax  [address] [place] [address]  Internal comment [facility] Details Fax  [address] [place] [address]  Internal comment [facility] Details Fax  [address], [place] [address]  Internal comment [facility] Details Fax  [address], [place] [address]  Internal comment [facility] Details Fax  [address] [place] [address]  Internal comment [facility] Details Fax  [address] [place], [place] [address]  Internal comment [facility] Details Fax  [address] [place] [address]  Internal comment

## 2020-10-24 NOTE — ED Notes (Signed)
Pt denies SI and HI at this time   

## 2020-10-24 NOTE — ED Notes (Signed)
Pt has been cooperative with care this shift. Pt resting in bed. Quiet and guarded. Pt noted to be talking to self.

## 2020-10-24 NOTE — BH Assessment (Addendum)
Comprehensive Clinical Assessment (CCA) Note  10/24/2020 JASIN BRAZEL [id number] Patient presents this date with AMS. Patient is with IVC, per IVC patient was found by [employer] in the middle of traffic praying. Brother notes erratic behavior the last few days. Patient gave consent for this writer to speak to his brother although patient is uncertain of phone number and there is not a number on file or per chart review. Patient contnues to present with AMS this date as this Clinical research associate attempts to assess. Patient renders limited history and cannot recall the events that transpired prior to arrival. As this Clinical research associate reviewed contents of IVC patient did not deny although stated " I did what I have to do." Patient will not elaborate on content of statement. Patient denies any S/I or H/I although reports active AVH although is vague in reference to content stating "I see and hear all." Patient reports using alcohol and Cannabis although states he is unsure of time frame and amounts used stating, "I use a little of both every day." Patient's UDS is positive for THC although BAL was less than 10 on arrival.  Per chart review patient has a limited mental health history although in 2017 when patient was seen for medical issues patient reported that he had been having VH after he stated that "he felt someone slipped something in his drink" after consuming "a few drinks at a friends house." Patient was positive for cocaine and THC that date. Patient this date denies any mental health history or previous attempts/gestures at self harm. Patient denies any access to firearms or history of abuse. Patient per notes had phoned a bomb threat in to his work earlier this week and has pending charges in reference to that event. When questioned this date patient reported he had been working at [employer] until four days ago. It is unclear if what transpired at his employment although patient states he no longer is employed there.   Per notes on  arrival this date EDP writes:    KIMBERLY COYE is a 40 y.o. male with a past medical history significant for paroxysmal A. fib not on any anticoagulants and hypertension who presents to the ED via [employer] due to erratic behavior.  Patient was found in the middle of traffic kneeling and praying.  [employer] spoke to patient's brother who notes patient has been acting in an abnormal manner for the past few days.  Patient called in a bomb threat at his job earlier in the week.  Patient admits to frequent alcohol consumption roughly 6-7 beers (16oz) daily.  He admits to drinking 1 16 ounce beer earlier today.  He also admits to marijuana use earlier today.  No other drugs.  Patient denies SI, HI, auditory/visual hallucinations.  He notes "the spirit moved him" caused him to kneel in the middle of traffic. No physical complaints.  Denies chest pain, shortness of breath, palpitations, abdominal pain, nausea, vomiting, diarrhea, fever, and headache.  No treatment prior to arrival.  Per [employer] patient's brother was bringing him to the hospital for a psychiatric evaluation; however, patient jump out of the car. [employer] also saw patient dive directly into a dumpster prior to arrival where he states he was looking for his room key at a hotel he didn't have a room at.  Patient is oriented x 3 and renders limited history. Patient does not seem to process the content of this writers questions at times during the assessment. Patient's memory is recent impaired. Patient  is disorganized and difficult to redirect. Patient reports active AVH although does not appear to be responding to internal stimuli at the time of assessment. Case was staffed with [facility] NP who recommended a inpatient admission.     Chief Complaint:  Chief Complaint  Patient presents with  . Psychiatric Evaluation  . Fever   Visit Diagnosis: Unspecified psychosis     CCA Screening, Triage and Referral (STR)  Patient Reported Information How did you hear  about [place]? Other (Comment) (Brought in by [facility])  Referral name: No data recorded Referral phone number: No data recorded  Whom do you see for routine medical problems? I don't have a doctor  Practice/Facility Name: No data recorded Practice/Facility Phone Number: No data recorded Name of Contact: No data recorded Contact Number: No data recorded Contact Fax Number: No data recorded Prescriber Name: No data recorded Prescriber Address (if known): No data recorded  What Is the Reason for Your Visit/Call Today? Altered mental state  How Long Has This Been Causing You Problems? <Week  What Do You Feel Would Help You the Most Today? Other (Comment) (To be determined)   Have You Recently Been in Any Inpatient Treatment (Hospital/Detox/Crisis Center/28-Day Program)? No  Name/Location of Program/Hospital:No data recorded How Long Were You There? No data recorded When Were You Discharged? No data recorded  Have You Ever Received Services From [facility] Before? No  Who Do You See at [facility]? No data recorded  Have You Recently Had Any Thoughts About Hurting Yourself? No  Are You Planning to Commit Suicide/Harm Yourself At This time? No   Have you Recently Had Thoughts About Hurting Someone Karolee Ohs? No  Explanation: No data recorded  Have You Used Any Alcohol or Drugs in the Past 24 Hours? Yes  How Long Ago Did You Use Drugs or Alcohol? 0000 (Unknown)  What Did You Use and How Much? Pt reports various amounts of alcohol and THC. UDS pending   Do You Currently Have a Therapist/Psychiatrist? No  Name of Therapist/Psychiatrist: No data recorded  Have You Been Recently Discharged From Any Office Practice or Programs? No  Explanation of Discharge From Practice/Program: No data recorded    CCA Screening Triage Referral Assessment Type of Contact: Face-to-Face  Is this Initial or Reassessment? No data recorded Date Telepsych consult ordered in [facility]:  No data recorded Time  Telepsych consult ordered in [facility]:  No data recorded  Patient Reported Information Reviewed? Yes  Patient Left Without Being Seen? No data recorded Reason for Not Completing Assessment: No data recorded  Collateral Involvement: No data recorded  Does Patient Have a Court Appointed Legal Guardian? No data recorded Name and Contact of Legal Guardian: No data recorded If Minor and Not Living with Parent(s), Who has Custody? No data recorded Is CPS involved or ever been involved? Never  Is APS involved or ever been involved? Never   Patient Determined To Be At Risk for Harm To Self or Others Based on Review of Patient Reported Information or Presenting Complaint? No  Method: No data recorded Availability of Means: No data recorded Intent: No data recorded Notification Required: No data recorded Additional Information for Danger to Others Potential: No data recorded Additional Comments for Danger to Others Potential: No data recorded Are There Guns or Other Weapons in Your Home? No data recorded Types of Guns/Weapons: No data recorded Are These Weapons Safely Secured?  No data recorded Who Could Verify You Are Able To Have These Secured: No data recorded Do You Have any Outstanding Charges, Pending Court Dates, Parole/Probation? No data recorded Contacted To Inform of Risk of Harm To Self or Others: Law Enforcement   Location of Assessment: [facility]   Does Patient Present under Involuntary Commitment? Yes  IVC Papers Initial File Date: 10/24/2020   [place] of Residence: [place]   Patient Currently Receiving the Following Services: Not Receiving Services   Determination of Need: -- (To be determined)   Options For Referral: -- (To be determined)     CCA Biopsychosocial Intake/Chief Complaint:  Altered mental state  Current Symptoms/Problems: No data recorded  Patient Reported Schizophrenia/Schizoaffective Diagnosis in Past: No   Strengths:  No data recorded Preferences: No data recorded Abilities: No data recorded  Type of Services Patient Feels are Needed: No data recorded  Initial Clinical Notes/Concerns: No data recorded  Mental Health Symptoms Depression:  -- (UTA)   Duration of Depressive symptoms: No data recorded  Mania:  None   Anxiety:   -- (UTA)   Psychosis:  No data recorded  Duration of Psychotic symptoms: No data recorded  Trauma:  None   Obsessions:  None   Compulsions:  None   Inattention:  None   Hyperactivity/Impulsivity:  N/A   Oppositional/Defiant Behaviors:  None   Emotional Irregularity:  None   Other Mood/Personality Symptoms:  No data recorded   Mental Status Exam Appearance and self-care  Stature:  Average   Weight:  Average weight   Clothing:  Casual   Grooming:  Normal   Cosmetic use:  None   Posture/gait:  Normal   Motor activity:  Slowed   Sensorium  Attention:  Confused   Concentration:  Preoccupied   Orientation:  Person; Situation; Place   Recall/memory:  Defective in Immediate   Affect and Mood  Affect:  Flat   Mood:  Depressed   Relating  Eye contact:  Fleeting   Facial expression:  Depressed   Attitude toward examiner:  Cooperative   Thought and Language  Speech flow: Garbled   Thought content:  Suspicious   Preoccupation:  None   Hallucinations:  Auditory; Visual   Organization:  No data recorded  [employer] of Knowledge:  Poor   Intelligence:  Average   Abstraction:  Functional   Judgement:  Poor   Reality Testing:  Realistic   Insight:  Poor   Decision Making:  Confused   Social Functioning  Social Maturity:  Responsible   Social Judgement:  Normal   Stress  Stressors:  Work   Coping Ability:  Deficient supports; Overwhelmed   Skill Deficits:  Decision making   Supports:  Family     Religion: Religion/Spirituality Are You A Religious Person?: No  Leisure/Recreation: Leisure /  Recreation Do You Have Hobbies?: No  Exercise/Diet: Exercise/Diet Do You Exercise?: No Have You Gained or Lost A Significant Amount of Weight in the Past Six Months?: No Do You Follow a Special Diet?: No Do You Have Any Trouble Sleeping?: No   CCA Employment/Education Employment/Work Situation: Employment / Work Psychologist, occupational Employment situation: Unemployed Has patient ever been in the [employer]?: No  Education:     CCA Family/Childhood History Family and Relationship History: Family history Marital status: Single  Childhood History:  Childhood History Did patient suffer any verbal/emotional/physical/sexual abuse as a child?: No Did patient suffer from severe childhood neglect?: No Has patient ever been sexually abused/assaulted/raped as an  adolescent or adult?: No Was the patient ever a victim of a crime or a disaster?: No Witnessed domestic violence?: No Has patient been affected by domestic violence as an adult?: No  Child/Adolescent Assessment:     CCA Substance Use Alcohol/Drug Use: Alcohol / Drug Use Pain Medications: See MAR Prescriptions: See MAR Over the Counter: See MAR History of alcohol / drug use?: Yes Substance #1 Name of Substance 1: Alcohol 1 - Age of First Use: UTA 1 - Amount (size/oz): UTA 1 - Frequency: UTA 1 - Duration: UTA 1 - Last Use / Amount: UTA Substance #2 Name of Substance 2: Cannabis 2 - Age of First Use: UTA 2 - Amount (size/oz): UTA 2 - Frequency: UTA 2 - Duration: UTA 2 - Last Use / Amount: UTA UDS post this date THC                     ASAM's:  Six Dimensions of Multidimensional Assessment  Dimension 1:  Acute Intoxication and/or Withdrawal Potential:   Dimension 1:  Description of individual's past and current experiences of substance use and withdrawal: 2  Dimension 2:  Biomedical Conditions and Complications:   Dimension 2:  Description of patient's biomedical conditions and  complications: 2  Dimension 3:   Emotional, Behavioral, or Cognitive Conditions and Complications:  Dimension 3:  Description of emotional, behavioral, or cognitive conditions and complications: 2  Dimension 4:  Readiness to Change:  Dimension 4:  Description of Readiness to Change criteria: 2  Dimension 5:  Relapse, Continued use, or Continued Problem Potential:  Dimension 5:  Relapse, continued use, or continued problem potential critiera description: 2  Dimension 6:  Recovery/Living Environment:  Dimension 6:  Recovery/Iiving environment criteria description: 2  ASAM Severity Score: ASAM's Severity Rating Score: 12  ASAM Recommended Level of Treatment:     Substance use Disorder (SUD) Substance Use Disorder (SUD)  Checklist Symptoms of Substance Use: Continued use despite having a persistent/recurrent physical/psychological problem caused/exacerbated by use  Recommendations for Services/Supports/Treatments: Recommendations for Services/Supports/Treatments Recommendations For Services/Supports/Treatments:  (To be determined)  DSM5 Diagnoses: Patient Active Problem List   Diagnosis Date Noted  . Atrial fibrillation with RVR (HCC) 09/01/2017  . Atrial fibrillation (HCC) 09/03/2012  . Syncope 09/03/2012  . OSA (obstructive sleep apnea) 09/03/2012    Patient Centered Plan: Patient is on the following Treatment Plan(s):    Referrals to Alternative Service(s): Referred to Alternative Service(s):   Place:   Date:   Time:    Referred to Alternative Service(s):   Place:   Date:   Time:    Referred to Alternative Service(s):   Place:   Date:   Time:    Referred to Alternative Service(s):   Place:   Date:   Time:     Alfredia Ferguson, LCAS

## 2020-10-25 MED ORDER — OLANZAPINE 5 MG PO TBDP
5.0000 mg | ORAL_TABLET | Freq: Every day | ORAL | Status: DC
  Administered 2020-10-25 – 2020-10-26 (×2): 5 mg via ORAL
  Filled 2020-10-25 (×2): qty 1

## 2020-10-25 NOTE — ED Notes (Signed)
Pt brother Vincent Mcpherson called and would like to be notified of when his brother is being d/c to another facility. Vincent Hunsberger [phone]

## 2020-10-25 NOTE — Care Management (Signed)
Per Everardo Pacific, no appropriate beds at [facility].  Writer will follow up on previous referrals that were sent yesterday.

## 2020-10-25 NOTE — BH Assessment (Signed)
Pt remains in the ED under IVC due to bizarre and threatening behavior -- per report, Pt was picked up by police after being found praying in the middle of the street.  Per family members, Pt made a bomb threat earlier in the week.  He was positive for marijuana use, and he admitted to alcohol use.   Pt was reassessed this AM.  He stated that he is doing well and sleeping well.  When asked why he was at the hospital, he stated that he was brought in by the police because he was ''running around in the street praying.''  Pt denied suicidal ideation and homicidal ideation.  Pt appeared to have little insight into the nature of his behavior.  Recommended continued inpatient.

## 2020-10-25 NOTE — ED Notes (Signed)
Pt has been up walking around in his room, gave himself a shower. Pt also made his bed and cleaned up his room. Pt has been pleasant with no signs of aggression.

## 2020-10-25 NOTE — ED Notes (Signed)
Pt has been cooperative this shift. Takes medications and eats with no problems.

## 2020-10-26 ENCOUNTER — Other Ambulatory Visit: Payer: Self-pay | Admitting: Registered Nurse

## 2020-10-26 ENCOUNTER — Other Ambulatory Visit: Payer: Self-pay

## 2020-10-26 ENCOUNTER — Encounter (HOSPITAL_COMMUNITY): Payer: Self-pay | Admitting: Psychiatry

## 2020-10-26 ENCOUNTER — Inpatient Hospital Stay (HOSPITAL_COMMUNITY)
Admission: AD | Admit: 2020-10-26 | Discharge: 2020-10-29 | DRG: 897 | Disposition: A | Discharge status: Home / Self Care | Payer: 59 | Source: Intra-hospital | Attending: Psychiatry | Admitting: Psychiatry

## 2020-10-26 DIAGNOSIS — F17210 Nicotine dependence, cigarettes, uncomplicated: Secondary | ICD-10-CM | POA: Diagnosis present

## 2020-10-26 DIAGNOSIS — R45851 Suicidal ideations: Secondary | ICD-10-CM | POA: Diagnosis present

## 2020-10-26 DIAGNOSIS — Z79899 Other long term (current) drug therapy: Secondary | ICD-10-CM | POA: Diagnosis not present

## 2020-10-26 DIAGNOSIS — Z20822 Contact with and (suspected) exposure to covid-19: Secondary | ICD-10-CM | POA: Diagnosis present

## 2020-10-26 DIAGNOSIS — I4891 Unspecified atrial fibrillation: Secondary | ICD-10-CM | POA: Diagnosis present

## 2020-10-26 DIAGNOSIS — F12159 Cannabis abuse with psychotic disorder, unspecified: Secondary | ICD-10-CM | POA: Diagnosis not present

## 2020-10-26 DIAGNOSIS — I10 Essential (primary) hypertension: Secondary | ICD-10-CM | POA: Diagnosis present

## 2020-10-26 DIAGNOSIS — G4700 Insomnia, unspecified: Secondary | ICD-10-CM | POA: Diagnosis present

## 2020-10-26 DIAGNOSIS — F29 Unspecified psychosis not due to a substance or known physiological condition: Secondary | ICD-10-CM | POA: Diagnosis present

## 2020-10-26 DIAGNOSIS — F12222 Cannabis dependence with intoxication with perceptual disturbance: Secondary | ICD-10-CM | POA: Diagnosis not present

## 2020-10-26 DIAGNOSIS — Z9119 Patient's noncompliance with other medical treatment and regimen: Secondary | ICD-10-CM | POA: Diagnosis not present

## 2020-10-26 DIAGNOSIS — F1020 Alcohol dependence, uncomplicated: Secondary | ICD-10-CM | POA: Diagnosis not present

## 2020-10-26 DIAGNOSIS — F23 Brief psychotic disorder: Secondary | ICD-10-CM | POA: Diagnosis not present

## 2020-10-26 DIAGNOSIS — Z9049 Acquired absence of other specified parts of digestive tract: Secondary | ICD-10-CM | POA: Diagnosis not present

## 2020-10-26 LAB — POC SARS CORONAVIRUS 2 AG -  ED: SARS Coronavirus 2 Ag: NEGATIVE

## 2020-10-26 MED ORDER — MAGNESIUM HYDROXIDE 400 MG/5ML PO SUSP: 30.0000 mL | Freq: Every day | ORAL | Status: DC | PRN

## 2020-10-26 MED ORDER — ALUM & MAG HYDROXIDE-SIMETH 200-200-20 MG/5ML PO SUSP: 30.0000 mL | ORAL | Status: DC | PRN

## 2020-10-26 MED ORDER — OLANZAPINE 5 MG PO TBDP
5.0000 mg | ORAL_TABLET | Freq: Every day | ORAL | Status: DC
  Administered 2020-10-26: 5 mg via ORAL
  Filled 2020-10-26 (×2): qty 1

## 2020-10-26 MED ORDER — ACETAMINOPHEN 325 MG PO TABS: 650.0000 mg | ORAL_TABLET | Freq: Four times a day (QID) | ORAL | Status: DC | PRN

## 2020-10-26 NOTE — Progress Notes (Signed)
Adult Psychoeducational Group Note  Date:  10/26/2020 Time:  10:36 PM  Group Topic/Focus:  Wrap-Up Group:   The focus of this group is to help patients review their daily goal of treatment and discuss progress on daily workbooks.  Participation Level:  Did Not Attend  Participation Quality:  Did Not Attend  Affect:  Did Not Attend  Cognitive:  Did Not Attend  Insight: None  Engagement in Group:  Did Not Attend  Modes of Intervention:  Did Not Attend  Additional Comments:  Pt did not attend evening wrap up group tonight.  Felipa Furnace 10/26/2020, 10:36 PM

## 2020-10-26 NOTE — ED Notes (Signed)
Pt off unit to [facility] per provider. Pt is alert, cooperative, calm. No s/s of distress.  DC information and belongings given to  [employer] for facility stay. Pt ambulatory off unit, escorted and transported by [employer].

## 2020-10-26 NOTE — Progress Notes (Signed)
Pt stated he had some issues before coming in, God told him to go to work and teel the people certain things. Pt was informed that there is a right and wrong way to do things along with the right times and places. Pt appeared to understand and stated he was doing a little better this evening.      10/26/20 2200  Psych Admission Type (Psych Patients Only)  Admission Status Voluntary  Psychosocial Assessment  Patient Complaints Anxiety  Eye Contact Brief  Facial Expression Anxious  Affect Anxious  Speech Logical/coherent  Interaction Assertive  Motor Activity Fidgety  Appearance/Hygiene In scrubs  Behavior Characteristics Cooperative  Mood Anxious  Thought Process  Coherency WDL  Content WDL  Delusions None reported or observed  Perception WDL  Hallucination UTA  Judgment Impaired  Confusion None  Danger to Self  Current suicidal ideation? Denies  Danger to Others  Danger to Others None reported or observed

## 2020-10-26 NOTE — Tx Team (Signed)
Initial Treatment Plan 10/26/2020 5:36 PM [name] [id number]    PATIENT STRESSORS: Health problems Legal issue Substance abuse   PATIENT STRENGTHS: Capable of independent living Communication skills   PATIENT IDENTIFIED PROBLEMS: "To get clarity"  "To get well"  Anxiety  Substance Abuse  Psychosis             DISCHARGE CRITERIA:  Ability to meet basic life and health needs Adequate post-discharge living arrangements  PRELIMINARY DISCHARGE PLAN: Attend aftercare/continuing care group Outpatient therapy Return to previous work or school arrangements  PATIENT/FAMILY INVOLVEMENT: This treatment plan has been presented to and reviewed with the patient, Vincent Mcpherson.  The patient and family have been given the opportunity to ask questions and make suggestions.  Clarene Critchley, RN 10/26/2020, 5:36 PM

## 2020-10-26 NOTE — BH Assessment (Signed)
BHH Assessment Progress Note  Per Shuvon Rankin, NP, this pt requires psychiatric hospitalization.  Everardo Pacific, RN, [facility] has assigned pt to [facility] Rm 508-2 to the service of Landry Mellow, MD.  [facility] will be ready to receive pt at 16:00.  Pt presents under IVC initiated by EDP Tilden Fossa, MD, and IVC documents have been faxed to [facility].  EDP Jacalyn Lefevre, MD and pt's nurse, Waynetta Sandy, have been notified, and Waynetta Sandy agrees to call report to [phone].  Pt is to be transported via [employer].   Doylene Canning, [place] Behavioral Health Coordinator [phone]

## 2020-10-26 NOTE — Progress Notes (Signed)
Admission Note: Patient is a 40 year old male admitted to the unit from [facility] under IVC for bizarre behavior and altered mental status.  Per report, patient was found praying in the middle of the street.  Patient had called in a bomb threat at his job earlier this week.  Patient appears guarded, anxious and forward little information during assessment.  Presents with a blunted affect and anxious mood.  Admission plan of care reviewed and consent signed.  Skin assessment and personal belongings completed.  Skin is dry and intact.  No contraband found.  States he is here to get clarity and to get well.  Patient oriented to the unit, staff and room.  Routine safety checks initiated.  Verbalizes understanding of unit rules/protocols.  Patient is safe on the unit.

## 2020-10-26 NOTE — ED Provider Notes (Signed)
Pt has been accepted to [facility] by Dr. Jola Babinski.  Pt is stable for transfer.   Jacalyn Lefevre, MD 10/26/20 1517

## 2020-10-27 DIAGNOSIS — F12222 Cannabis dependence with intoxication with perceptual disturbance: Secondary | ICD-10-CM

## 2020-10-27 DIAGNOSIS — F1020 Alcohol dependence, uncomplicated: Secondary | ICD-10-CM

## 2020-10-27 DIAGNOSIS — F29 Unspecified psychosis not due to a substance or known physiological condition: Secondary | ICD-10-CM

## 2020-10-27 MED ORDER — THIAMINE HCL 100 MG PO TABS
100.0000 mg | ORAL_TABLET | Freq: Every day | ORAL | Status: DC
  Administered 2020-10-27 – 2020-10-29 (×3): 100 mg via ORAL
  Filled 2020-10-27 (×4): qty 1

## 2020-10-27 MED ORDER — TRAZODONE HCL 50 MG PO TABS
50.0000 mg | ORAL_TABLET | Freq: Every evening | ORAL | Status: DC | PRN
  Administered 2020-10-27 – 2020-10-28 (×2): 50 mg via ORAL
  Filled 2020-10-27 (×2): qty 1

## 2020-10-27 MED ORDER — LORAZEPAM 1 MG PO TABS: 1.0000 mg | ORAL_TABLET | Freq: Four times a day (QID) | ORAL | Status: DC | PRN

## 2020-10-27 MED ORDER — LORAZEPAM 1 MG PO TABS: 1.0000 mg | ORAL_TABLET | ORAL | Status: DC | PRN

## 2020-10-27 MED ORDER — HYDROXYZINE HCL 25 MG PO TABS
25.0000 mg | ORAL_TABLET | Freq: Three times a day (TID) | ORAL | Status: DC | PRN
  Administered 2020-10-28: 25 mg via ORAL
  Filled 2020-10-27: qty 1

## 2020-10-27 MED ORDER — OLANZAPINE 10 MG PO TBDP
10.0000 mg | ORAL_TABLET | Freq: Every day | ORAL | Status: DC
  Administered 2020-10-27 – 2020-10-28 (×2): 10 mg via ORAL
  Filled 2020-10-27 (×4): qty 1

## 2020-10-27 MED ORDER — ZIPRASIDONE MESYLATE 20 MG IM SOLR: 20.0000 mg | INTRAMUSCULAR | Status: DC | PRN

## 2020-10-27 MED ORDER — OLANZAPINE 10 MG PO TBDP: 10.0000 mg | ORAL_TABLET | Freq: Three times a day (TID) | ORAL | Status: DC | PRN

## 2020-10-27 MED ORDER — NICOTINE 21 MG/24HR TD PT24
21.0000 mg | MEDICATED_PATCH | Freq: Every day | TRANSDERMAL | Status: DC
  Administered 2020-10-27: 21 mg via TRANSDERMAL
  Filled 2020-10-27 (×2): qty 1

## 2020-10-27 MED ORDER — FOLIC ACID 1 MG PO TABS
1.0000 mg | ORAL_TABLET | Freq: Every day | ORAL | Status: DC
  Administered 2020-10-27 – 2020-10-29 (×3): 1 mg via ORAL
  Filled 2020-10-27 (×4): qty 1
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
## 2020-10-27 NOTE — Progress Notes (Signed)
   10/27/20 0500  Sleep  Number of Hours 3

## 2020-10-27 NOTE — Progress Notes (Signed)
Pt continues to be paranoid on the unit, but cooperative     10/27/20 2200  Psych Admission Type (Psych Patients Only)  Admission Status Voluntary  Psychosocial Assessment  Patient Complaints Anxiety  Eye Contact Fair  Facial Expression Anxious;Pensive  Affect Anxious;Preoccupied  Patent attorney;Restless  Appearance/Hygiene Unremarkable  Behavior Characteristics Cooperative  Mood Preoccupied;Pleasant;Suspicious  Thought Process  Coherency Concrete thinking  Content Paranoia  Delusions Paranoid  Perception WDL  Hallucination None reported or observed  Judgment Poor  Confusion None  Danger to Self  Current suicidal ideation? Denies  Danger to Others  Danger to Others None reported or observed
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
## 2020-10-27 NOTE — [facility] Suicide Risk Assessment (Signed)
[facility] INPATIENT:  Family/Significant Other Suicide Prevention Education  Suicide Prevention Education:  Contact Attempts: cousin, Latrail Pounders [phone]) has been identified by the patient as the family member/significant other with whom the patient will be residing, and identified as the person(s) who will aid the patient in the event of a mental health crisis.  With written consent from the patient, two attempts were made to provide suicide prevention education, prior to and/or following the patient's discharge.  We were unsuccessful in providing suicide prevention education.  A suicide education pamphlet was given to the patient to share with family/significant other.   Date and time of first attempt: 10/27/2020 at 2:30pm CSW left a HIPAA compliant voicemail for [employer]. Date and time of second attempt: Second attempt still needs to be made.   Ruthann Cancer MSW, LCSW Clincal Social Worker  [facility]

## 2020-10-27 NOTE — Progress Notes (Signed)
Recreation Therapy Notes  Date: 2.8.22 Time: 1000 Location: 500 Hall Dayroom  Group Topic: Wellness  Goal Area(s) Addresses:  Patient will define components of whole wellness. Patient will verbalize benefit of whole wellness.  Intervention:  Music  Activity: Exercise and Meditation.  LRT led group in a series of stretches to loosen up the muscles.  Patients then took turns leading the group in exercises of their choosing.  Patients could take breaks and get water as needed.  LRT closed out group playing a meditation for the group that focused on making the most of your day.  Education: Wellness, Building control surveyor.   Education Outcome: Acknowledges education/In group clarification offered/Needs additional education.   Clinical Observations/Feedback: Pt did not attend group session.    Caroll Rancher, LRT/CTRS    Lillia Abed, Lakina Mcintire A 10/27/2020 11:18 AM

## 2020-10-27 NOTE — H&P (Signed)
Psychiatric Admission Assessment Adult  Patient Identification: Vincent Mcpherson MRN:  [id number] Date of Evaluation:  10/27/2020 Chief Complaint:  Psychotic disorder [facility]) [F29] Principal Diagnosis: <principal problem not specified> Diagnosis:  Active Problems:   Psychotic disorder (HCC)  History of Present Illness: Patient is seen and examined.  Patient is a 40 year old male with a past psychiatric history significant for cannabis use disorder as well as alcohol use disorder who originally presented to the [facility] emergency department on 10/23/2020 brought in by [facility] police.  The patient was apparently kneeling and praying in the street causing a traffic hazard.  When [facility] police arrived the patient had stated he had a room at the extended stay hotel, but when [facility] police to ask it was verified that he did not have a room.  The patient then walked off and dove headfirst into a dumpster.  He told police he was trying to find his identification paperwork.  The patient did report that he had been using ecstasy as well as marijuana.  The patient's brother reported at that time that the patient had issues at work 4 days prior to this, and had been working at [employer].  Patient apparently called a bomb threat into his job the same day.  In the emergency department the comprehensive clinical assessment found that he had been having auditory hallucinations.  He stated at that time "I did what I had to do".  The patient was transferred to our facility on 2/7 for continued care.  The patient stated today that he had been spreading a message of God.  God had told him to spread a message.  He stated that he had not heard God speak to him since he had been in the hospital.  Review of the electronic medical record revealed multiple emergency room visits, primarily related to an underlying history of atrial fibrillation and hypertension.  He denied previous psychiatric admissions.  He  denied previous admissions for detoxification or for substance rehabilitation.  He denied suicidal or homicidal ideation.  He denied any paranoid delusions.  He did admit that he has a court date pending for assault, and I assume it has something to do with calling in the bomb scare at his workplace.  He was started on Zyprexa on admission.  He stated he slept well with that.  He denied any auditory or visual hallucinations currently.  He denied any suicidal or homicidal ideation.  He was admitted to the hospital for evaluation and stabilization.  Associated Signs/Symptoms: Depression Symptoms:  anhedonia, insomnia, psychomotor agitation, fatigue, suicidal thoughts without plan, anxiety, loss of energy/fatigue, disturbed sleep, Duration of Depression Symptoms: No data recorded (Hypo) Manic Symptoms:  Delusions, Distractibility, Hallucinations, Impulsivity, Irritable Mood, Labiality of Mood, Anxiety Symptoms:  Excessive Worry, Psychotic Symptoms:  Delusions, Hallucinations: Auditory Paranoia, Duration of Psychotic Symptoms: No data recorded PTSD Symptoms: Negative Total Time spent with patient: 45 minutes  Past Psychiatric History: Patient is a longstanding history of polysubstance use disorders.  He has had multiple emergency room visits and medical hospitalizations for atrial fibrillation, chest pain, hypertension.  He denied any previous psychiatric admissions.  He denied any hospitalizations or rehab admissions in the past.  Is the patient at risk to self? Yes.    Has the patient been a risk to self in the past 6 months? Yes.    Has the patient been a risk to self within the distant past? No.  Is the patient a risk to others? Yes.  Has the patient been a risk to others in the past 6 months? Yes.    Has the patient been a risk to others within the distant past? No.   Prior Inpatient Therapy:   Prior Outpatient Therapy:    Alcohol Screening: 1. How often do you have a drink  containing alcohol?: Never 2. How many drinks containing alcohol do you have on a typical day when you are drinking?: 1 or 2 3. How often do you have six or more drinks on one occasion?: Never AUDIT-C Score: 0 4. How often during the last year have you found that you were not able to stop drinking once you had started?: Never 5. How often during the last year have you failed to do what was normally expected from you because of drinking?: Never 6. How often during the last year have you needed a first drink in the morning to get yourself going after a heavy drinking session?: Never 7. How often during the last year have you had a feeling of guilt of remorse after drinking?: Never 8. How often during the last year have you been unable to remember what happened the night before because you had been drinking?: Never 9. Have you or someone else been injured as a result of your drinking?: No 10. Has a relative or friend or a doctor or another health worker been concerned about your drinking or suggested you cut down?: No Alcohol Use Disorder Identification Test Final Score (AUDIT): 0 Substance Abuse History in the last 12 months:  Yes.   Consequences of Substance Abuse: Medical Consequences:  It appears his substance use disorder is contributed to the psychiatric admission as well as contributing to his previous admissions medically for atrial fibrillation. Previous Psychotropic Medications: No  Psychological Evaluations: No  Past Medical History:  Past Medical History:  Diagnosis Date   Atrial fibrillation (HCC)    Hypertension    Palpitations    Syncope 05/04/12   while visiting another pt at [facility]   Varicose vein     Past Surgical History:  Procedure Laterality Date   APPENDECTOMY     Family History:  Family History  Problem Relation Age of Onset   Atrial fibrillation Mother    Family Psychiatric  History: Unknown Tobacco Screening:   Social History:  Social History    Substance and Sexual Activity  Alcohol Use Yes     Social History   Substance and Sexual Activity  Drug Use Yes   Types: Marijuana    Additional Social History: Marital status: Single Are you sexually active?: No What is your sexual orientation?: Heterosexual Has your sexual activity been affected by drugs, alcohol, medication, or emotional stress?: Denie Does patient have children?: Yes How many children?: 3 How is patient's relationship with their children?: Has 3 daughters which he states he has a great relationship with                         Allergies:   Allergies  Allergen Reactions   Penicillins Rash    Has patient had a PCN reaction causing immediate rash, facial/tongue/throat swelling, SOB or lightheadedness with hypotension: Yes Has patient had a PCN reaction causing severe rash involving mucus membranes or skin necrosis: Yes Has patient had a PCN reaction that required hospitalization: Yes Has patient had a PCN reaction occurring within the last 10 years: No If all of the above answers are "NO", then may proceed with Cephalosporin use.  Lab Results:  Results for orders placed or performed during the hospital encounter of 10/23/20 (from the past 48 hour(s))  POC SARS Coronavirus 2 Ag-ED - Nasal Swab     Status: None   Collection Time: 10/26/20 10:40 AM  Result Value Ref Range   SARS Coronavirus 2 Ag NEGATIVE NEGATIVE    Comment: (NOTE) SARS-CoV-2 antigen NOT DETECTED.   Negative results are presumptive.  Negative results do not preclude SARS-CoV-2 infection and should not be used as the sole basis for treatment or other patient management decisions, including infection  control decisions, particularly in the presence of clinical signs and  symptoms consistent with COVID-19, or in those who have been in contact with the virus.  Negative results must be combined with clinical observations, patient history, and epidemiological information. The  expected result is Negative.  Fact Sheet for Patients: https://www.jennings-kim.com/https://www.fda.gov/media/141569/download  Fact Sheet for Healthcare Providers: https://alexander-rogers.biz/https://www.fda.gov/media/141568/download  This test is not yet approved or cleared by the [place] FDA and  has been authorized for detection and/or diagnosis of SARS-CoV-2 by FDA under an Emergency Use Authorization (EUA).  This EUA will remain in effect (meaning this test can be used) for the duration of  the COV ID-19 declaration under Section 564(b)(1) of the Act, 21 U.S.C. section 360bbb-3(b)(1), unless the authorization is terminated or revoked sooner.      Blood Alcohol level:  Lab Results  Component Value Date   ETH <10 10/23/2020    Metabolic Disorder Labs:  No results found for: HGBA1C, MPG No results found for: PROLACTIN Lab Results  Component Value Date   CHOL 176 10/24/2020   TRIG 84 10/24/2020   HDL 46 10/24/2020   CHOLHDL 3.8 10/24/2020   VLDL 17 10/24/2020   LDLCALC 113 (H) 10/24/2020    Current Medications: Current Facility-Administered Medications  Medication Dose Route Frequency Provider Last Rate Last Admin   acetaminophen (TYLENOL) tablet 650 mg  650 mg Oral Q6H PRN Rankin, Shuvon B, NP       alum & mag hydroxide-simeth (MAALOX/MYLANTA) 200-200-20 MG/5ML suspension 30 mL  30 mL Oral Q4H PRN Rankin, Shuvon B, NP       folic acid (FOLVITE) tablet 1 mg  1 mg Oral Daily Antonieta Pertlary, Aquila Menzie Lawson, MD       hydrOXYzine (ATARAX/VISTARIL) tablet 25 mg  25 mg Oral TID PRN Antonieta Pertlary, Fidel Caggiano Lawson, MD       OLANZapine zydis (ZYPREXA) disintegrating tablet 10 mg  10 mg Oral Q8H PRN Antonieta Pertlary, Akemi Overholser Lawson, MD       And   LORazepam (ATIVAN) tablet 1 mg  1 mg Oral PRN Antonieta Pertlary, Layne Dilauro Lawson, MD       And   ziprasidone (GEODON) injection 20 mg  20 mg Intramuscular PRN Antonieta Pertlary, Marcela Alatorre Lawson, MD       LORazepam (ATIVAN) tablet 1 mg  1 mg Oral Q6H PRN Antonieta Pertlary, Lucan Riner Lawson, MD       magnesium hydroxide (MILK OF MAGNESIA) suspension 30 mL  30  mL Oral Daily PRN Rankin, Shuvon B, NP       OLANZapine zydis (ZYPREXA) disintegrating tablet 10 mg  10 mg Oral QHS Antonieta Pertlary, Nikeya Maxim Lawson, MD       thiamine tablet 100 mg  100 mg Oral Daily Antonieta Pertlary, Kevin Mario Lawson, MD       traZODone (DESYREL) tablet 50 mg  50 mg Oral QHS PRN Antonieta Pertlary, Mico Spark Lawson, MD       PTA Medications: Medications Prior to Admission  Medication Sig Dispense  Refill Last Dose   Ascorbic Acid (VITAMIN C PO) Take 1 tablet by mouth daily.      ELDERBERRY PO Take 1 tablet by mouth daily.      Multiple Vitamins-Minerals (ZINC PO) Take 1 tablet by mouth daily.      naproxen sodium (ALEVE) 220 MG tablet Take 660 mg by mouth daily as needed (headache).       Musculoskeletal: Strength & Muscle Tone: within normal limits Gait & Station: normal Patient leans: N/A  Psychiatric Specialty Exam: Physical Exam Vitals and nursing note reviewed.  HENT:     Head: Normocephalic and atraumatic.  Pulmonary:     Effort: Pulmonary effort is normal.  Neurological:     General: No focal deficit present.     Mental Status: He is alert and oriented to person, place, and time.     Review of Systems  Blood pressure 109/77, pulse 86, temperature (!) 97.4 F (36.3 C), temperature source Oral, resp. rate 20, height 5\' 10"  (1.778 m), weight 115.2 kg, SpO2 100 %.Body mass index is 36.45 kg/m.  General Appearance: Disheveled  Eye Contact:  Fair  Speech:  Normal Rate  Volume:  Decreased  Mood:  Depressed and Dysphoric  Affect:  Congruent  Thought Process:  Coherent and Descriptions of Associations: Circumstantial  Orientation:  Full (Time, Place, and Person)  Thought Content:  Delusions, Hallucinations: Auditory and Paranoid Ideation  Suicidal Thoughts:  No  Homicidal Thoughts:  No  Memory:  Immediate;   Poor Recent;   Poor Remote;   Poor  Judgement:  Impaired  Insight:  Lacking  Psychomotor Activity:  Decreased  Concentration:  Concentration: Fair  Recall:  of Knowledge:   Fair  Language:  Good  Akathisia:  Negative  Handed:  Right  AIMS (if indicated):     Assets:  Desire for Improvement Resilience  ADL's:  Intact  Cognition:  WNL  Sleep:  Number of Hours: 3    Treatment Plan Summary: Daily contact with patient to assess and evaluate symptoms and progress in treatment, Medication management and Plan : Patient is seen and examined.  Patient is a 40 year old male with the above-stated past medical and psychiatric history who was admitted secondary to bizarre behavior, auditory hallucinations and grandiose delusions.  He will be admitted to the hospital.  He will be integrated in the milieu.  He will be encouraged to attend groups.  He was started on Zyprexa 5 mg p.o. nightly last night, but only got 3 hours of sleep.  We will increase that dosage to 10 mg p.o. nightly.  He also has a history of atrial fibrillation as well as hypertension.  He is also had a syncopal episode in the past.  He told me today that he had not taken the diltiazem after his admission and evaluations previously.  His EKG from 10/23/2020 showed sinus tachycardia but no evidence of atrial fibrillation.  The quality of the EKG was not good.  We will reorder that.  The patient denied any complicated symptoms of alcohol withdrawal.  He denied any seizure activity.  Review of his admission laboratories revealed a mildly low sodium at 133.  His potassium was normal.  Liver function enzymes were normal.  His creatinine was normal at 1.06.  Lipid panel was done, and was essentially normal.  His CBC revealed essentially normal CBC and platelets.  His acetaminophen on admission was less than 10, salicylate less than 7.  His respiratory panel was negative for  influenza A, B and coronavirus.  Blood alcohol on 2/4 was less than 10.  Drug screen was positive for marijuana.  He had a CT scan of the head that was essentially normal.  Observation Level/Precautions:  15 minute checks  Laboratory:  Chemistry Profile   Psychotherapy:    Medications:    Consultations:    Discharge Concerns:    Estimated LOS:  Other:     Physician Treatment Plan for Primary Diagnosis: <principal problem not specified> Long Term Goal(s): Improvement in symptoms so as ready for discharge  Short Term Goals: Ability to identify changes in lifestyle to reduce recurrence of condition will improve, Ability to verbalize feelings will improve, Ability to demonstrate self-control will improve, Ability to identify and develop effective coping behaviors will improve, Ability to maintain clinical measurements within normal limits will improve and Ability to identify triggers associated with substance abuse/mental health issues will improve  Physician Treatment Plan for Secondary Diagnosis: Active Problems:   Psychotic disorder (HCC)  Long Term Goal(s): Improvement in symptoms so as ready for discharge  Short Term Goals: Ability to identify changes in lifestyle to reduce recurrence of condition will improve, Ability to verbalize feelings will improve, Ability to demonstrate self-control will improve, Ability to identify and develop effective coping behaviors will improve, Ability to maintain clinical measurements within normal limits will improve and Ability to identify triggers associated with substance abuse/mental health issues will improve  I certify that inpatient services furnished can reasonably be expected to improve the patient's condition.    Antonieta Pert, MD 2/8/202212:07 PM

## 2020-10-27 NOTE — Progress Notes (Signed)
Progress note    10/27/20 1215  Psych Admission Type (Psych Patients Only)  Admission Status Voluntary  Psychosocial Assessment  Patient Complaints Anxiety  Eye Contact Fair  Facial Expression Anxious;Pensive  Affect Anxious;Preoccupied  Patent attorney;Restless  Appearance/Hygiene Unremarkable  Behavior Characteristics Cooperative;Appropriate to situation;Anxious;Fidgety  Mood Anxious;Suspicious;Preoccupied;Pleasant  Thought Administrator, sports thinking  Content Paranoia  Delusions Paranoid  Perception WDL  Hallucination None reported or observed  Judgment Poor  Confusion None  Danger to Self  Current suicidal ideation? Denies  Danger to Others  Danger to Others None reported or observed

## 2020-10-27 NOTE — Plan of Care (Signed)
  Problem: Education: Goal: Knowledge of [facility] Education information/materials will improve Outcome: Progressing Goal: Emotional status will improve Outcome: Progressing Goal: Mental status will improve Outcome: Progressing Goal: Verbalization of understanding the information provided will improve Outcome: Progressing   

## 2020-10-27 NOTE — Progress Notes (Signed)
Recreation Therapy Notes  INPATIENT RECREATION THERAPY ASSESSMENT  Patient Details Name: Vincent Mcpherson MRN: [id number] DOB: Aug 30, 1981 Today's Date: 10/27/2020       Information Obtained From: Patient  Able to Participate in Assessment/Interview: Yes  Patient Presentation: Alert  Reason for Admission (Per Patient): Other (Comments) (Pt stated he was praying in the middle of the street.)  Patient Stressors:  (None identified)  Coping Skills:   Journal,Sports,Music,Exercise,Meditate,Deep Music therapist (Comment) Radiation protection practitioner)  Leisure Interests (2+):  Exercise - Walking,Individual - Reading,Individual - Writing,Individual - Other (Comment) (Talk about God)  Frequency of Recreation/Participation: Other (Comment) (Daily)  Awareness of Community Resources:  Yes  Community Resources:  [place],Other (Comment) (Trail)  Current Use: Yes  If no, Barriers?:    Expressed Interest in [employer] Information: No  County of Residence:  [place]  Patient Main Form of Transportation: Car  Patient Strengths:  Communication; Understanding  Patient Identified Areas of Improvement:  "fall back and listen instead of talking all the time"  Patient Goal for Hospitalization:  "get mental relaxation, clarity and understanding while here"  Current SI (including self-harm):  No  Current HI:  No  Current AVH: No  Staff Intervention Plan: Group Attendance,Collaborate with Interdisciplinary Treatment Team  Consent to Intern Participation: N/A    Caroll Rancher, LRT/CTRS  Caroll Rancher A 10/27/2020, 12:58 PM

## 2020-10-28 LAB — TSH: TSH: 1.655 u[IU]/mL (ref 0.350–4.500)

## 2020-10-28 LAB — HEMOGLOBIN A1C
Hgb A1c MFr Bld: 4.8 % (ref 4.8–5.6)
Mean Plasma Glucose: 91.06 mg/dL

## 2020-10-28 MED ORDER — NICOTINE POLACRILEX 2 MG MT GUM: 2.0000 mg | CHEWING_GUM | OROMUCOSAL | Status: DC | PRN

## 2020-10-28 NOTE — Progress Notes (Signed)
Recreation Therapy Notes  Date: 2.9.22 Time: 1000 Location: [address] Dayroom  Group Topic: Anger Management  Goal Area(s) Addresses:  Patient will identify triggers for anger.  Patient will identify reactions to anger. Patient will identify coping skills for dealing with anger.   Intervention: Conversation with group, and worksheets  Activity: Introduction to Anger Management.  Patients were to identify triggers to anger, actions they exhibit when angry, any problems they have encountered because of anger and coping skills they can use to deal with anger.  Education: Anger Management, Discharge Planning   Education Outcome: Acknowledges education/In group clarification offered/Needs additional education.   Clinical Observations/Feedback: Pt did not attend group session.     Caroll Rancher, LRT/CTRS         Lillia Abed, Syvanna Ciolino A 10/28/2020 11:30 AM

## 2020-10-28 NOTE — Progress Notes (Signed)
Pt did not attend orientation group.  

## 2020-10-28 NOTE — Tx Team (Signed)
Interdisciplinary Treatment and Diagnostic Plan Update  10/28/2020 Time of Session: 9:20am Vincent Mcpherson MRN: [id number]  Principal Diagnosis: <principal problem not specified>  Secondary Diagnoses: Active Problems:   Psychotic disorder (HCC)   Current Medications:  Current Facility-Administered Medications  Medication Dose Route Frequency Provider Last Rate Last Admin  . acetaminophen (TYLENOL) tablet 650 mg  650 mg Oral Q6H PRN Rankin, Shuvon B, NP      . alum & mag hydroxide-simeth (MAALOX/MYLANTA) 200-200-20 MG/5ML suspension 30 mL  30 mL Oral Q4H PRN Rankin, Shuvon B, NP      . folic acid (FOLVITE) tablet 1 mg  1 mg Oral Daily Antonieta Pert, MD   1 mg at 10/28/20 0756  . hydrOXYzine (ATARAX/VISTARIL) tablet 25 mg  25 mg Oral TID PRN Antonieta Pert, MD      . OLANZapine zydis (ZYPREXA) disintegrating tablet 10 mg  10 mg Oral Q8H PRN Antonieta Pert, MD       And  . LORazepam (ATIVAN) tablet 1 mg  1 mg Oral PRN Antonieta Pert, MD       And  . ziprasidone (GEODON) injection 20 mg  20 mg Intramuscular PRN Antonieta Pert, MD      . LORazepam (ATIVAN) tablet 1 mg  1 mg Oral Q6H PRN Antonieta Pert, MD      . magnesium hydroxide (MILK OF MAGNESIA) suspension 30 mL  30 mL Oral Daily PRN Rankin, Shuvon B, NP      . nicotine (NICODERM CQ - dosed in mg/24 hours) patch 21 mg  21 mg Transdermal Daily Antonieta Pert, MD   21 mg at 10/27/20 1817  . OLANZapine zydis (ZYPREXA) disintegrating tablet 10 mg  10 mg Oral QHS Antonieta Pert, MD   10 mg at 10/27/20 2040  . thiamine tablet 100 mg  100 mg Oral Daily Antonieta Pert, MD   100 mg at 10/28/20 0756  . traZODone (DESYREL) tablet 50 mg  50 mg Oral QHS PRN Antonieta Pert, MD   50 mg at 10/27/20 2040   PTA Medications: Medications Prior to Admission  Medication Sig Dispense Refill Last Dose  . Ascorbic Acid (VITAMIN C PO) Take 1 tablet by mouth daily.     Marland Kitchen ELDERBERRY PO Take 1 tablet by mouth daily.      . Multiple Vitamins-Minerals (ZINC PO) Take 1 tablet by mouth daily.     . naproxen sodium (ALEVE) 220 MG tablet Take 660 mg by mouth daily as needed (headache).       Patient Stressors: Health problems Legal issue Substance abuse  Patient Strengths: Capable of independent living Communication skills  Treatment Modalities: Medication Management, Group therapy, Case management,  1 to 1 session with clinician, Psychoeducation, Recreational therapy.   Physician Treatment Plan for Primary Diagnosis: <principal problem not specified> Long Term Goal(s): Improvement in symptoms so as ready for discharge Improvement in symptoms so as ready for discharge   Short Term Goals: Ability to identify changes in lifestyle to reduce recurrence of condition will improve Ability to verbalize feelings will improve Ability to demonstrate self-control will improve Ability to identify and develop effective coping behaviors will improve Ability to maintain clinical measurements within normal limits will improve Ability to identify triggers associated with substance abuse/mental health issues will improve Ability to identify changes in lifestyle to reduce recurrence of condition will improve Ability to verbalize feelings will improve Ability to demonstrate self-control will improve Ability to identify and develop  effective coping behaviors will improve Ability to maintain clinical measurements within normal limits will improve Ability to identify triggers associated with substance abuse/mental health issues will improve  Medication Management: Evaluate patient's response, side effects, and tolerance of medication regimen.  Therapeutic Interventions: 1 to 1 sessions, Unit Group sessions and Medication administration.  Evaluation of Outcomes: Progressing  Physician Treatment Plan for Secondary Diagnosis: Active Problems:   Psychotic disorder (HCC)  Long Term Goal(s): Improvement in symptoms so as  ready for discharge Improvement in symptoms so as ready for discharge   Short Term Goals: Ability to identify changes in lifestyle to reduce recurrence of condition will improve Ability to verbalize feelings will improve Ability to demonstrate self-control will improve Ability to identify and develop effective coping behaviors will improve Ability to maintain clinical measurements within normal limits will improve Ability to identify triggers associated with substance abuse/mental health issues will improve Ability to identify changes in lifestyle to reduce recurrence of condition will improve Ability to verbalize feelings will improve Ability to demonstrate self-control will improve Ability to identify and develop effective coping behaviors will improve Ability to maintain clinical measurements within normal limits will improve Ability to identify triggers associated with substance abuse/mental health issues will improve     Medication Management: Evaluate patient's response, side effects, and tolerance of medication regimen.  Therapeutic Interventions: 1 to 1 sessions, Unit Group sessions and Medication administration.  Evaluation of Outcomes: Progressing   RN Treatment Plan for Primary Diagnosis: <principal problem not specified> Long Term Goal(s): Knowledge of disease and therapeutic regimen to maintain health will improve  Short Term Goals: Ability to remain free from injury will improve, Ability to verbalize frustration and anger appropriately will improve, Ability to participate in decision making will improve, Ability to identify and develop effective coping behaviors will improve and Compliance with prescribed medications will improve  Medication Management: RN will administer medications as ordered by provider, will assess and evaluate patient's response and provide education to patient for prescribed medication. RN will report any adverse and/or side effects to prescribing  provider.  Therapeutic Interventions: 1 on 1 counseling sessions, Psychoeducation, Medication administration, Evaluate responses to treatment, Monitor vital signs and CBGs as ordered, Perform/monitor CIWA, COWS, AIMS and Fall Risk screenings as ordered, Perform wound care treatments as ordered.  Evaluation of Outcomes: Progressing   LCSW Treatment Plan for Primary Diagnosis: <principal problem not specified> Long Term Goal(s): Safe transition to appropriate next level of care at discharge, Engage patient in therapeutic group addressing interpersonal concerns.  Short Term Goals: Engage patient in aftercare planning with referrals and resources, Increase social support, Increase ability to appropriately verbalize feelings, Identify triggers associated with mental health/substance abuse issues and Increase skills for wellness and recovery  Therapeutic Interventions: Assess for all discharge needs, 1 to 1 time with Social worker, Explore available resources and support systems, Assess for adequacy in community support network, Educate family and significant other(s) on suicide prevention, Complete Psychosocial Assessment, Interpersonal group therapy.  Evaluation of Outcomes: Progressing   Progress in Treatment: Attending groups: No. Participating in groups: No. Taking medication as prescribed: Yes. Toleration medication: Yes. Family/Significant other contact made: No, will contact:  patients cousin  Patient understands diagnosis: Yes. Discussing patient identified problems/goals with staff: Yes. Medical problems stabilized or resolved: Yes. Denies suicidal/homicidal ideation: Yes. Issues/concerns per patient self-inventory: No.   New problem(s) identified: No, Describe:  none  New Short Term/Long Term Goal(s): detox, medication management for mood stabilization; elimination of SI thoughts; development of comprehensive  mental wellness/sobriety plan  Patient Goals:  "To relax and have  calm thoughts"  Discharge Plan or Barriers: Patient is to return to live with his daughter. Patient is to follow up with outpatient provider for therapy and medication management  Reason for Continuation of Hospitalization: Delusions  Medication stabilization  Estimated Length of Stay: 3-5 days  Attendees: Patient: Vincent Mcpherson 10/28/2020  Physician: Landry Mellow, MD 10/28/2020   Nursing:  10/28/2020   RN Care Manager: 10/28/2020  Social Worker: Ruthann Cancer, LCSW 10/28/2020   Recreational Therapist:  10/28/2020  Other:  10/28/2020   Other:  10/28/2020  Other: 10/28/2020    Scribe for Treatment Team: Otelia Santee, LCSW 10/28/2020 9:43 AM

## 2020-10-28 NOTE — [facility] Group Notes (Signed)
[facility] LCSW Group Therapy  10/28/2020 2:14 PM  Participation Level:  Active  Type of Therapy and Topic: Group Therapy: Gratitude  Description of Group: The purpose behind this group is to get people thinking about things for which  they can be grateful. If continued over time, they might begin to spontaneously look for things and  situations for which to be grateful. Gratitude is related to a "wide variety of forms of wellbeing , whereas "negative attributions" can adversely affect relationships.  Several studies have shown that interventions to  increase gratitude can impact areas such as overall life satisfaction, decreased negative affect, increased happiness, the ability to provide emotional support to others, and decreased worrying.   Therapeutic Goals: 1. Patient will learn activities that focus on gratitude in their daily lives. 2. Patient will share gratitude in their daily lives. 3. Patient will learn to develop healthy habits and positive thinking techniques. 4. Patient will receive support and feedback from others  Therapeutic Modalities: Cognitive Behavioral Therapy Solution Focused Therapy Motivational Interviewing  Summary of Progress/Problems: Patient attended and participated in group. Patient shared that he is grateful for ne beginnings, opportunities, growth, and past barrier/road blocks in his life.   Nickisha Hum A Steven Basso 10/28/2020, 2:14 PM

## 2020-10-28 NOTE — Plan of Care (Signed)
  Problem: Self-Concept: Goal: Ability to identify factors that promote anxiety will improve Outcome: Progressing Goal: Level of anxiety will decrease Outcome: Progressing Goal: Ability to modify response to factors that promote anxiety will improve Outcome: Progressing   

## 2020-10-28 NOTE — Progress Notes (Addendum)
[facility] MD Progress Note  10/28/2020 10:56 AM Vincent Mcpherson  MRN:  [id number] Subjective: Patient is a 40 year old male with a past psychiatric history significant for cannabis use disorder, alcohol use disorder as well as cocaine use disorder ecstasy use disorder (basically polysubstance use disorders), who was admitted on 10/27/2020 after having been found kneeling and praying in the street and causing a traffic hazard as well as potentially leading to self-harm.  Objective: Patient is seen and examined.  Patient is a 40 year old male with the above-stated past psychiatric history who is seen in follow-up.  He stated he feels better today.  He stated he slept better last night.  He denied any auditory or visual hallucinations.  He denied any suicidal or homicidal ideation.  We discussed his drug and alcohol use.  I explained that his atrial fibrillation could most likely be a direct result of his alcohol use.  Luckily on this admission his EKG showed a sinus rhythm.  He has been noncompliant with his treatment for atrial fibrillation as well as hypertension in the past.  We again discussed the possibility of substance abuse treatment whether that be a residential program or a local program.  We discussed the potential for death of his continued use of substances should he flip into atrial fibrillation and potentially cause a pulmonary emboli.  He seemed as though he was unaware of that fact.  Initially this morning his blood pressure was 145/87, repeat was 122/86.  His pulse was between 78 and 93.  He is afebrile.  His pulse oximetry was between 97 and 98% on room air.  Review of his laboratories showed no new laboratories.  His EKG from 10/27/2020 showed a normal sinus rhythm with a QTc interval of 470.  Principal Problem: <principal problem not specified> Diagnosis: Active Problems:   Psychotic disorder (HCC)  Total Time spent with patient: 20 minutes  Past Psychiatric History: See admission H&P  Past  Medical History:  Past Medical History:  Diagnosis Date  . Atrial fibrillation (HCC)   . Hypertension   . Palpitations   . Syncope 05/04/12   while visiting another pt at [facility]  . Varicose vein     Past Surgical History:  Procedure Laterality Date  . APPENDECTOMY     Family History:  Family History  Problem Relation Age of Onset  . Atrial fibrillation Mother    Family Psychiatric  History: See admission H&P Social History:  Social History   Substance and Sexual Activity  Alcohol Use Yes     Social History   Substance and Sexual Activity  Drug Use Yes  . Types: Marijuana    Social History   Socioeconomic History  . Marital status: Single    Spouse name: Not on file  . Number of children: Not on file  . Years of education: Not on file  . Highest education level: Not on file  Occupational History  . Not on file  Tobacco Use  . Smoking status: Current Every Day Smoker    Packs/day: 1.00    Types: Cigarettes  . Smokeless tobacco: Never Used  Vaping Use  . Vaping Use: Never used  Substance and Sexual Activity  . Alcohol use: Yes  . Drug use: Yes    Types: Marijuana  . Sexual activity: Not on file  Other Topics Concern  . Not on file  Social History Narrative  . Not on file   Social Determinants of Health   Financial Resource Strain: Not on  file  Food Insecurity: Not on file  Transportation Needs: Not on file  Physical Activity: Not on file  Stress: Not on file  Social Connections: Not on file   Additional Social History:                         Sleep: Fair  Appetite:  Fair  Current Medications: Current Facility-Administered Medications  Medication Dose Route Frequency Provider Last Rate Last Admin  . acetaminophen (TYLENOL) tablet 650 mg  650 mg Oral Q6H PRN Rankin, Shuvon B, NP      . alum & mag hydroxide-simeth (MAALOX/MYLANTA) 200-200-20 MG/5ML suspension 30 mL  30 mL Oral Q4H PRN Rankin, Shuvon B, NP      . folic acid (FOLVITE)  tablet 1 mg  1 mg Oral Daily Antonieta Pert, MD   1 mg at 10/28/20 0756  . hydrOXYzine (ATARAX/VISTARIL) tablet 25 mg  25 mg Oral TID PRN Antonieta Pert, MD      . OLANZapine zydis (ZYPREXA) disintegrating tablet 10 mg  10 mg Oral Q8H PRN Antonieta Pert, MD       And  . LORazepam (ATIVAN) tablet 1 mg  1 mg Oral PRN Antonieta Pert, MD       And  . ziprasidone (GEODON) injection 20 mg  20 mg Intramuscular PRN Antonieta Pert, MD      . LORazepam (ATIVAN) tablet 1 mg  1 mg Oral Q6H PRN Antonieta Pert, MD      . magnesium hydroxide (MILK OF MAGNESIA) suspension 30 mL  30 mL Oral Daily PRN Rankin, Shuvon B, NP      . nicotine (NICODERM CQ - dosed in mg/24 hours) patch 21 mg  21 mg Transdermal Daily Antonieta Pert, MD   21 mg at 10/27/20 1817  . OLANZapine zydis (ZYPREXA) disintegrating tablet 10 mg  10 mg Oral QHS Antonieta Pert, MD   10 mg at 10/27/20 2040  . thiamine tablet 100 mg  100 mg Oral Daily Antonieta Pert, MD   100 mg at 10/28/20 0756  . traZODone (DESYREL) tablet 50 mg  50 mg Oral QHS PRN Antonieta Pert, MD   50 mg at 10/27/20 2040    Lab Results:  Results for orders placed or performed during the hospital encounter of 10/26/20 (from the past 48 hour(s))  Hemoglobin A1c     Status: None   Collection Time: 10/28/20  6:37 AM  Result Value Ref Range   Hgb A1c MFr Bld 4.8 4.8 - 5.6 %    Comment: (NOTE) Pre diabetes:          5.7%-6.4%  Diabetes:              >6.4%  Glycemic control for   <7.0% adults with diabetes    Mean Plasma Glucose 91.06 mg/dL    Comment: Performed at [facility], [address] [place], [place] [address]  TSH     Status: None   Collection Time: 10/28/20  6:37 AM  Result Value Ref Range   TSH 1.655 0.350 - 4.500 uIU/mL    Comment: Performed by a 3rd Generation assay with a functional sensitivity of <=0.01 uIU/mL. Performed at [facility], [address], [place], [place] [address]      Blood Alcohol level:  Lab Results  Component Value Date   [facility] <10 10/23/2020    Metabolic Disorder Labs: Lab Results  Component Value Date   HGBA1C 4.8 10/28/2020   MPG 91.06 10/28/2020   No results found for: PROLACTIN Lab Results  Component Value Date   CHOL 176 10/24/2020   TRIG 84 10/24/2020   HDL 46 10/24/2020   CHOLHDL 3.8 10/24/2020   VLDL 17 10/24/2020   LDLCALC 113 (H) 10/24/2020    Physical Findings: AIMS: Facial and Oral Movements Muscles of Facial Expression: None, normal Lips and Perioral Area: None, normal Jaw: None, normal Tongue: None, normal,Extremity Movements Upper (arms, wrists, hands, fingers): None, normal Lower (legs, knees, ankles, toes): None, normal, Trunk Movements Neck, shoulders, hips: None, normal, Overall Severity Severity of abnormal movements (highest score from questions above): None, normal Incapacitation due to abnormal movements: None, normal Patient's awareness of abnormal movements (rate only patient's report): No Awareness, Dental Status Current problems with teeth and/or dentures?: No Does patient usually wear dentures?: No  CIWA:    COWS:     Musculoskeletal: Strength & Muscle Tone: within normal limits Gait & Station: normal Patient leans: N/A  Psychiatric Specialty Exam: Physical Exam Vitals and nursing note reviewed.  HENT:     Head: Normocephalic and atraumatic.  Pulmonary:     Effort: Pulmonary effort is normal.  Neurological:     General: No focal deficit present.     Mental Status: He is alert and oriented to person, place, and time.     Review of Systems  Blood pressure 122/86, pulse 93, temperature 98 F (36.7 C), temperature source Oral, resp. rate 20, height 5\' 10"  (1.778 m), weight 115.2 kg, SpO2 97 %.Body mass index is 36.45 kg/m.  General Appearance: Fairly Groomed  Eye Contact:  Fair  Speech:  Normal Rate  Volume:  Normal  Mood:  Dysphoric  Affect:  Congruent  Thought Process:  Coherent  and Descriptions of Associations: Circumstantial  Orientation:  Full (Time, Place, and Person)  Thought Content:  Logical  Suicidal Thoughts:  No  Homicidal Thoughts:  No  Memory:  Immediate;   Fair Recent;   Fair Remote;   Fair  Judgement:  Impaired  Insight:  Lacking  Psychomotor Activity:  Normal  Concentration:  Concentration: Fair and Attention Span: Fair  Recall:  [employer] of Knowledge:  Fair  Language:  Good  Akathisia:  Negative  Handed:  Right  AIMS (if indicated):     Assets:  Desire for Improvement Resilience  ADL's:  Intact  Cognition:  WNL  Sleep:  Number of Hours: 5.75     Treatment Plan Summary: Daily contact with patient to assess and evaluate symptoms and progress in treatment, Medication management and Plan : Patient is seen and examined.  Patient is a 40 year old male with the above-stated past psychiatric history who is seen in follow-up.   Diagnosis: 1.  Substance-induced psychotic disorder. 2.  Polysubstance dependence including alcohol, cannabis, cocaine and amphetamines (in the form of ecstasy). 3.  History of atrial fibrillation, currently in sinus rhythm.  Most likely secondary to his alcohol and drug use. 4.  History of hypertension but currently's stable here.  Pertinent findings on examination today: 1.  Psychotic symptoms seem to be clearing and improving. 2.  Fair sleep last night. 3.  EKG shows no evidence of atrial fibrillation at this point. 4.  Discussed the serious implications of continued drug and alcohol use with regard to atrial fibrillation and the risk for pulmonary emboli. 5.  Discussion of potential residential and nonresidential substance abuse treatment programs.  Plan: 1.  Continue folic  acid 1 mg p.o. daily for nutritional supplementation. 2.  Continue hydroxyzine 25 mg p.o. 3 times daily as needed anxiety. 3.  Continue lorazepam 1 mg p.o. every 6 hours as needed a CIWA greater than 10. 4.  Continue Zyprexa 10 mg p.o.  nightly for psychosis and sleep. 5.  Continue Zyprexa Zydis agitation protocol as needed. 6.  Continue thiamine 100 mg p.o. daily for nutritional supplementation. 7.  Continue trazodone 50 mg p.o. nightly as needed insomnia. 8.  Disposition planning-in progress.  Antonieta Pert, MD 10/28/2020, 10:56 AM

## 2020-10-28 NOTE — Progress Notes (Signed)
Progress note    10/28/20 0756  Psych Admission Type (Psych Patients Only)  Admission Status Voluntary  Psychosocial Assessment  Patient Complaints Anxiety  Eye Contact Fair  Facial Expression Anxious;Pensive  Affect Anxious;Preoccupied  Furniture conservator/restorer;Restless  Appearance/Hygiene Unremarkable  Behavior Characteristics Cooperative;Appropriate to situation;Anxious  Mood Anxious;Preoccupied;Pleasant  Thought Process  Coherency Concrete thinking  Content Paranoia  Delusions Paranoid  Perception WDL  Hallucination None reported or observed  Judgment Poor  Confusion None  Danger to Self  Current suicidal ideation? Denies  Danger to Others  Danger to Others None reported or observed

## 2020-10-28 NOTE — [facility] Suicide Risk Assessment (Signed)
[facility] INPATIENT:  Family/Significant Other Suicide Prevention Education  Suicide Prevention Education: Education Completed; cousin, Vincent Mcpherson [phone]) has been identified by the patient as the family member/significant other with whom the patient will be residing, and identified as the person(s) who will aid the patient in the event of a mental health crisis (suicidal ideations/suicide attempt).  With written consent from the patient, the family member/significant other has been provided the following suicide prevention education, prior to the and/or following the discharge of the patient.  The suicide prevention education provided includes the following:  Suicide risk factors  Suicide prevention and interventions  National Suicide Hotline telephone number  [facility] assessment telephone number  [facility] Emergency Assistance 911  [facility] and/or Residential Mobile Crisis Unit telephone number   Request made of family/significant other to:  Remove weapons (e.g., guns, rifles, knives), all items previously/currently identified as safety concern.    Remove drugs/medications (over-the-counter, prescriptions, illicit drugs), all items previously/currently identified as a safety concern.   The family member/significant other verbalizes understanding of the suicide prevention education information provided.  The family member/significant other agrees to remove the items of safety concern listed above.  Per patients cousin this patient has been dealing with a lot of stress recently. He stated this patients mother has been in the hospital with COVID and Pneumonia and he has been concerned about her. Stated he has also been stressed at his job due to his hours and his workload.   Cousin has no concerns with this patient being discharged.   Ruthann Cancer MSW, LCSW Clincal Social Worker  [facility]

## 2020-10-28 NOTE — Progress Notes (Signed)
   10/28/20 0500  Sleep  Number of Hours 5.75

## 2020-10-29 DIAGNOSIS — F23 Brief psychotic disorder: Secondary | ICD-10-CM

## 2020-10-29 MED ORDER — OLANZAPINE 10 MG PO TABS: 10.0000 mg | ORAL_TABLET | Freq: Every day | ORAL | 0 refills | Status: DC

## 2020-10-29 MED ORDER — OLANZAPINE 10 MG PO TABS
10.0000 mg | ORAL_TABLET | Freq: Every day | ORAL | Status: DC
  Filled 2020-10-29: qty 1

## 2020-10-29 NOTE — Progress Notes (Signed)
  [facility] Adult Case Management Discharge Plan :  Will you be returning to the same living situation after discharge:  Yes,  to stay with daughter  At discharge, do you have transportation home?: Yes,  cousin to pick this patient up Do you have the ability to pay for your medications: Yes,  has insurance  Release of information consent forms completed and in the chart;  Patient's signature needed at discharge.  Patient to Follow up at:  Follow-up Information    Burchette, Santiago Bumpers, LCAS. Go on 11/06/2020.   Why: You have an appointment for therapy and medication management services on 11/06/20 at 11:00 am. This appointment will be held in person. Contact information: [address] [place] [address] [phone]               Next level of care provider has access to [facility] Link:no  Safety Planning and Suicide Prevention discussed: Yes,  with cousin     Has patient been referred to the Quitline?: N/A patient is not a smoker  Patient has been referred for addiction treatment: Pt. refused referral  Otelia Santee, LCSW 10/29/2020, 9:16 AM

## 2020-10-29 NOTE — [facility] Suicide Risk Assessment (Signed)
[facility] Discharge Suicide Risk Assessment   Principal Problem: <principal problem not specified> Discharge Diagnoses: Active Problems:   Psychotic disorder (HCC)   Total Time spent with patient: 20 minutes  Musculoskeletal: Strength & Muscle Tone: within normal limits Gait & Station: normal Patient leans: N/A  Psychiatric Specialty Exam: Review of Systems  All other systems reviewed and are negative.   Blood pressure 129/81, pulse (!) 104, temperature (!) 97.1 F (36.2 C), temperature source Oral, resp. rate 20, height 5\' 10"  (1.778 m), weight 115.2 kg, SpO2 100 %.Body mass index is 36.45 kg/m.  General Appearance: Casual  Eye Contact::  Good  Speech:  Normal Rate409  Volume:  Normal  Mood:  Euthymic  Affect:  Congruent  Thought Process:  Coherent and Descriptions of Associations: Intact  Orientation:  Full (Time, Place, and Person)  Thought Content:  Logical  Suicidal Thoughts:  No  Homicidal Thoughts:  No  Memory:  Immediate;   Fair Recent;   Fair Remote;   Fair  Judgement:  Intact  Insight:  Fair  Psychomotor Activity:  Normal  Concentration:  Good  Recall:  Fair  Fund of Knowledge:Fair  Language: Good  Akathisia:  Negative  Handed:  Right  AIMS (if indicated):     Assets:  Desire for Improvement Resilience  Sleep:  Number of Hours: 5.75  Cognition: WNL  ADL's:  Intact   Mental Status Per Nursing Assessment::   On Admission:  NA  Demographic Factors:  Male, Low socioeconomic status, Living alone and Unemployed  Loss Factors: Financial problems/change in socioeconomic status  Historical Factors: Impulsivity  Risk Reduction Factors:   Positive social support  Continued Clinical Symptoms:  Alcohol/Substance Abuse/Dependencies  Cognitive Features That Contribute To Risk:  None    Suicide Risk:  Minimal: No identifiable suicidal ideation.  Patients presenting with no risk factors but with morbid ruminations; may be classified as minimal risk based  on the severity of the depressive symptoms   Follow-up Information    Burchette, [phone], LCAS. Go on 11/06/2020.   Why: You have an appointment for therapy and medication management services on 11/06/20 at 11:00 am. This appointment will be held in person. Contact information: [address] [place] [address]               Plan Of Care/Follow-up recommendations:  Activity:  ad lib  [phone], MD 10/29/2020, 9:14 AM

## 2020-10-29 NOTE — Progress Notes (Signed)
   10/29/20 0500  Sleep  Number of Hours 5.75

## 2020-10-29 NOTE — Plan of Care (Signed)
Pt did not complete goal because pt attended one recreation therapy group session.   Caroll Rancher, LRT/CTRS

## 2020-10-29 NOTE — Discharge Summary (Signed)
Physician Discharge Summary Note  Patient:  Vincent Mcpherson is an 40 y.o., male MRN:  [id number] DOB:  1980-11-29 Patient phone:  [phone] (home)  Patient address:   [address] [place] [address],  Total Time spent with patient: Greater than 30 minutes  Date of Admission:  10/26/2020  Date of Discharge: 10-29-20  Reason for Admission: Drug induced psychosis/bizarre behaviors.  Principal Problem: Psychotic disorder [facility])  Discharge Diagnoses: Principal Problem:   Psychotic disorder [facility])  Past Psychiatric History: Cannabis use disorder, Psychotic disorder.  Past Medical History:  Past Medical History:  Diagnosis Date  . Atrial fibrillation (HCC)   . Hypertension   . Palpitations   . Syncope 05/04/12   while visiting another pt at [facility]  . Varicose vein     Past Surgical History:  Procedure Laterality Date  . APPENDECTOMY     Family History:  Family History  Problem Relation Age of Onset  . Atrial fibrillation Mother    Family Psychiatric  History: See H&P  Social History:  Social History   Substance and Sexual Activity  Alcohol Use Yes     Social History   Substance and Sexual Activity  Drug Use Yes  . Types: Marijuana    Social History   Socioeconomic History  . Marital status: Single    Spouse name: Not on file  . Number of children: Not on file  . Years of education: Not on file  . Highest education level: Not on file  Occupational History  . Not on file  Tobacco Use  . Smoking status: Current Every Day Smoker    Packs/day: 1.00    Types: Cigarettes  . Smokeless tobacco: Never Used  Vaping Use  . Vaping Use: Never used  Substance and Sexual Activity  . Alcohol use: Yes  . Drug use: Yes    Types: Marijuana  . Sexual activity: Not on file  Other Topics Concern  . Not on file  Social History Narrative  . Not on file   Social Determinants of Health   Financial Resource Strain: Not on file  Food Insecurity: Not on file   Transportation Needs: Not on file  Physical Activity: Not on file  Stress: Not on file  Social Connections: Not on file   Hospital Course: (Per Md's admission evaluation notes): Patient is a 40 year old male with a past psychiatric history significant for cannabis use disorder as well as alcohol use disorder who originally presented to the [facility] emergency department on 10/23/2020 brought in by [facility] police. The patient was apparently kneeling and praying in the street causing a traffic hazard. When [facility] police arrived the patient had stated he had a room at the extended stay hotel, but when [facility] police to ask it was verified that he did not have a room. The patient then walked off and dove headfirst into a dumpster. He told police he was trying to find his identification paperwork. The patient did report that he had been using ecstasy as well as marijuana. The patient's brother reported at that time that the patient had issues at work 4 days prior to this, and had been working at [employer].Patient apparently called a bomb threat into his job the same day. In the emergency department the comprehensive clinical assessment found that he had been having auditory hallucinations. He stated at that time "I did what I had to do". The patient was transferred to our facility on 2/7 for continued care. The patient  stated today that he had been spreading a message of God. God hadtold him to spread a message. He stated that he had not heard God speak to him since he had been in the hospital. Review of the electronic medical record revealed multiple emergency room visits, primarily related to an underlying history of atrial fibrillation and hypertension. He denied previous psychiatric admissions. He denied previous admissions for detoxification or for substance rehabilitation. He denied suicidal or homicidal ideation. He denied any paranoid delusions. He did admit  that he has a court date pending for assault, and I assume it has something to do with calling in the bomb scare at his workplace. He was started on Zyprexa on admission. He stated he slept well with that. He denied any auditory or visual hallucinations currently. He denied any suicidal or homicidal ideation. He was admitted to the hospital for evaluation and stabilization.   This is Vincent Mcpherson's first psychiatric admission/discharge summary from this [facility]. He was admitted with complaint of worsening psychosis & bizarre behaviors. He was found kneeling/praying on the streets causing traffic hazard. He was brought to the ED for evaluation/treatments. His UDS was positive for cannabis. After evaluation of his presenting symptoms, Vincent Mcpherson was recommended for mood stabilization treatments by his treatment team. And with his consent, he received, stabilized & was discharged on the medications as listed below on his discharge medication lists. He was also enrolled & participated in the group counseling sessions being offered & held on this unit. He learned coping skills. He presented no other significant pre-existing medical condition other than minor aches & pains. He was treated & discharged on Naproxen. He tolerated his treatment regimen without any adverse effects or reactions reported.   Vincent Mcpherson's symptoms responded well to his treatment regimen. This is evidenced by his daily reports of improved symptoms, absence of psychosis, presentation of good affect/eye contact. During the course of his hospitalization, the 15-minute checks were adequate to ensure Vincent Mcpherson's safety. Patient did not display any dangerous, violent or suicidal behavior on the unit.  He interacted with patients & staff appropriately. He participated appropriately in the group sessions/therapies being offered & held on this unit. He learned coping skills. His medications were addressed & adjusted to meet his needs. He was recommended for  outpatient follow-up care & medication management upon discharge to assure continuity of care.  At the time of discharge patient is not reporting any acute suicidal/homicidal ideations. He feels more confident about his self & mental health care. He currently denies any new issues or concerns. Education and supportive counseling provided throughout his hospital stay & upon discharge.   Today upon his discharge evaluation with the attending psychiatrist, Keiron shares he is doing well. He denies any other specific concerns. He is sleeping well. His appetite is good. He denies other physical complaints. He denies AH/VH, delusional thoughts or paranoia. He feels that his medications have been helpful & is in agreement to continue his current treatment regimen as recommended. He was able to engage in safety planning including plan to return to [facility] or contact emergency services if he feels unable to maintain his own safety or the safety of others. Pt had no further questions, comments, or concerns. He left [facility] with all personal belongings in no apparent distress. Transportation per his family (cousin).  Physical Findings: AIMS: Facial and Oral Movements Muscles of Facial Expression: None, normal Lips and Perioral Area: None, normal Jaw: None, normal Tongue: None, normal,Extremity Movements Upper (arms, wrists, hands, fingers): None,  normal Lower (legs, knees, ankles, toes): None, normal, Trunk Movements Neck, shoulders, hips: None, normal, Overall Severity Severity of abnormal movements (highest score from questions above): None, normal Incapacitation due to abnormal movements: None, normal Patient's awareness of abnormal movements (rate only patient's report): No Awareness, Dental Status Current problems with teeth and/or dentures?: No Does patient usually wear dentures?: No  CIWA:    COWS:     Musculoskeletal: Strength & Muscle Tone: within normal limits Gait & Station: normal Patient leans:  N/A  Psychiatric Specialty Exam: Physical Exam Vitals and nursing note reviewed.  HENT:     Head: Normocephalic.     Nose: Nose normal.     Mouth/Throat:     Pharynx: Oropharynx is clear.  Eyes:     Pupils: Pupils are equal, round, and reactive to light.  Cardiovascular:     Rate and Rhythm: Normal rate.     Pulses: Normal pulses.  Pulmonary:     Effort: Pulmonary effort is normal.  Abdominal:     Palpations: Abdomen is soft.  Genitourinary:    Comments: Deferred Musculoskeletal:        General: Normal range of motion.     Cervical back: Normal range of motion.  Skin:    General: Skin is warm and dry.  Neurological:     General: No focal deficit present.     Mental Status: He is alert and oriented to person, place, and time.     Review of Systems  Constitutional: Negative for chills, diaphoresis and fever.  HENT: Negative for congestion, sneezing and sore throat.   Eyes: Negative for discharge.  Respiratory: Negative for cough, shortness of breath and wheezing.   Cardiovascular: Negative for chest pain and palpitations.  Gastrointestinal: Negative for diarrhea, nausea and vomiting.  Endocrine: Negative for cold intolerance.  Genitourinary: Negative for difficulty urinating.  Musculoskeletal: Negative for arthralgias.  Skin: Negative.   Allergic/Immunologic: Negative for environmental allergies and food allergies.       Allergies: NKDA  Neurological: Negative for dizziness, tremors, seizures, syncope, facial asymmetry, speech difficulty, weakness, light-headedness, numbness and headaches.  Psychiatric/Behavioral: Positive for dysphoric mood (Stabilized with medication prior to discharge), hallucinations (Hx. of (stabilized with medication prior to discharge)) and sleep disturbance (Stabilized with medication prior to discharge). Negative for agitation, behavioral problems, confusion, decreased concentration, self-injury and suicidal ideas. The patient is not  nervous/anxious (Stable) and is not hyperactive.     Blood pressure 129/81, pulse (!) 104, temperature (!) 97.1 F (36.2 C), temperature source Oral, resp. rate 20, height 5\' 10"  (1.778 m), weight 115.2 kg, SpO2 100 %.Body mass index is 36.45 kg/m.  See Md's discharge SRA  Sleep:  Number of Hours: 5.75   Has this patient used any form of tobacco in the last 30 days? (Cigarettes, Smokeless Tobacco, Cigars, and/or Pipes) Yes, N/A  Blood Alcohol level:  Lab Results  Component Value Date   ETH <10 10/23/2020   Metabolic Disorder Labs:  Lab Results  Component Value Date   HGBA1C 4.8 10/28/2020   MPG 91.06 10/28/2020   No results found for: PROLACTIN Lab Results  Component Value Date   CHOL 176 10/24/2020   TRIG 84 10/24/2020   HDL 46 10/24/2020   CHOLHDL 3.8 10/24/2020   VLDL 17 10/24/2020   LDLCALC 113 (H) 10/24/2020   See Psychiatric Specialty Exam and Suicide Risk Assessment completed by Attending Physician prior to discharge.  Discharge destination:  Home  Is patient on multiple antipsychotic therapies at discharge:  No   Has Patient had three or more failed trials of antipsychotic monotherapy by history:  No  Recommended Plan for Multiple Antipsychotic Therapies: NA  Discharge Instructions    Diet - low sodium heart healthy   Complete by: As directed    Increase activity slowly   Complete by: As directed      Allergies as of 10/29/2020      Reactions   Penicillins Rash   Has patient had a PCN reaction causing immediate rash, facial/tongue/throat swelling, SOB or lightheadedness with hypotension: Yes Has patient had a PCN reaction causing severe rash involving mucus membranes or skin necrosis: Yes Has patient had a PCN reaction that required hospitalization: Yes Has patient had a PCN reaction occurring within the last 10 years: No If all of the above answers are "NO", then may proceed with Cephalosporin use.      Medication List    STOP taking these  medications   ELDERBERRY PO   VITAMIN C PO   ZINC PO     TAKE these medications     Indication  naproxen sodium 220 MG tablet Commonly known as: ALEVE Take 660 mg by mouth daily as needed (headache).  Indication: Joint Damage causing Pain and Loss of Function   OLANZapine 10 MG tablet Commonly known as: ZYPREXA Take 1 tablet (10 mg total) by mouth at bedtime.  Indication: psychosis       Follow-up Information    Burchette, Santiago Bumpers, LCAS. Go on 11/06/2020.   Why: You have an appointment for therapy and medication management services on 11/06/20 at 11:00 am. This appointment will be held in person. Contact information: [address] [place] [address] [phone]              Follow-up recommendations: Activity:  As tolerated Diet: As recommended by your primary care doctor. Keep all scheduled follow-up appointments as recommended.   Comments: Prescriptions given at discharge.  Patient agreeable to plan.  Given opportunity to ask questions.  Appears to feel comfortable with discharge denies any current suicidal or homicidal thought. Patient is also instructed prior to discharge to: Take all medications as prescribed by his/her mental healthcare provider. Report any adverse effects and or reactions from the medicines to his/her outpatient provider promptly. Patient has been instructed & cautioned: To not engage in alcohol and or illegal drug use while on prescription medicines. In the event of worsening symptoms, patient is instructed to call the crisis hotline, 911 and or go to the nearest ED for appropriate evaluation and treatment of symptoms. To follow-up with his/her primary care provider for your other medical issues, concerns and or health care needs.  Signed: Armandina Stammer, NP, PMHNP, FNP-BC 10/29/2020, 10:14 AM

## 2020-10-29 NOTE — Progress Notes (Signed)
Recreation Therapy Notes  INPATIENT RECREATION TR PLAN  Patient Details Name: Vincent Mcpherson MRN: [id number] DOB: Nov 20, 1980 Today's Date: 10/29/2020  Rec Therapy Plan Is patient appropriate for Therapeutic Recreation?: Yes Treatment times per week: about 3 days Estimated Length of Stay: 5-7 days TR Treatment/Interventions: Group participation (Comment)  Discharge Criteria Pt will be discharged from therapy if:: Discharged Treatment plan/goals/alternatives discussed and agreed upon by:: Patient/family  Discharge Summary Short term goals set: See patient care plan Short term goals met: Not met Progress toward goals comments: Groups attended Which groups?: Other (Comment) (Team Building) Reason goals not met: Pt attended one group session. Therapeutic equipment acquired: N/A Reason patient discharged from therapy: Discharge from hospital Pt/family agrees with progress & goals achieved: Yes Date patient discharged from therapy: 10/29/20   [name], LRT/CTRS  Ria Comment, [name] 10/29/2020, 11:44 AM

## 2020-10-29 NOTE — Progress Notes (Signed)
Recreation Therapy Notes  Date: 2.10.22 Time: 0950 Location: [address] Dayroom   Group Topic: Communication, Team Building, Problem Solving  Goal Area(s) Addresses:  Patient will effectively work with peer towards shared goal.  Patient will identify skills used to make activity successful.  Patient will share challenges and verbalize solution-driven approaches used. Patient will identify how skills used during activity can be used to reach post d/c goals.   Behavioral Response: Engaged  Intervention: STEM Activity   Activity: [employer]. Patients were provided the following materials: 5 drinking straws, 5 rubber bands, 5 paper clips, 2 index cards and 2 drinking cups. Using the provided materials patients were asked to build a launching mechanism to launch a ping pong ball across the room, approximately 10 feet. Patients were divided into teams of 3-5. Instructions required all materials be incorporated into the device, functionality of items left to the peer group's discretion.  Education: Pharmacist, community, Scientist, physiological, Air cabin crew, Building control surveyor.   Education Outcome: Acknowledges education/In group clarification offered/Needs additional education.   Clinical Observations/Feedback: Pt worked on his own to create his Psychologist, sport and exercise.  Pt was active and social with peers.  Pt was attentive and focused on completing his launcher.  Pt worked until discharge.     Caroll Rancher, LRT/CTRS     Caroll Rancher A 10/29/2020 11:15 AM

## 2020-10-29 NOTE — Progress Notes (Signed)
D: Pt A & O X 3. Denies SI, HI, AVH and pain at this time. D/C home as ordered. Picked up in lobby by his brother. A: D/C instructions reviewed with pt including prescription and follow up appointment; compliance encouraged. All belongings from locker 40 given to pt at time of departure. Scheduled and  medications given with verbal education and effects monitored. Safety checks maintained without incident till time of d/c.  R: Pt receptive to care. Compliant with medications when offered. Denies adverse drug reactions when assessed. Verbalized understanding related to d/c instructions. Pt signed belonging sheet in agreement with items received from locker. Ambulatory with a steady gait. Appears to be in no physical distress at time of departure.

## 2020-11-09 ENCOUNTER — Emergency Department (HOSPITAL_COMMUNITY)
Admission: EM | Admit: 2020-11-09 | Discharge: 2020-11-09 | Disposition: A | Discharge status: Home / Self Care | Payer: Managed Care, Other (non HMO) | Attending: Emergency Medicine | Admitting: Emergency Medicine

## 2020-11-09 ENCOUNTER — Encounter (HOSPITAL_COMMUNITY): Payer: Self-pay

## 2020-11-09 ENCOUNTER — Emergency Department (HOSPITAL_COMMUNITY): Payer: Managed Care, Other (non HMO)

## 2020-11-09 ENCOUNTER — Other Ambulatory Visit: Payer: Self-pay

## 2020-11-09 DIAGNOSIS — I10 Essential (primary) hypertension: Secondary | ICD-10-CM | POA: Insufficient documentation

## 2020-11-09 DIAGNOSIS — F17210 Nicotine dependence, cigarettes, uncomplicated: Secondary | ICD-10-CM | POA: Diagnosis not present

## 2020-11-09 DIAGNOSIS — M79641 Pain in right hand: Secondary | ICD-10-CM | POA: Insufficient documentation

## 2020-11-09 DIAGNOSIS — M25531 Pain in right wrist: Secondary | ICD-10-CM | POA: Insufficient documentation

## 2020-11-09 DIAGNOSIS — M109 Gout, unspecified: Secondary | ICD-10-CM

## 2020-11-09 MED ORDER — KETOROLAC TROMETHAMINE 60 MG/2ML IM SOLN
30.0000 mg | Freq: Once | INTRAMUSCULAR | Status: AC
  Administered 2020-11-09: 30 mg via INTRAMUSCULAR
  Filled 2020-11-09: qty 2

## 2020-11-09 MED ORDER — OXYCODONE-ACETAMINOPHEN 5-325 MG PO TABS: 1.0000 | ORAL_TABLET | ORAL | 0 refills | Status: DC | PRN

## 2020-11-09 MED ORDER — DEXAMETHASONE SODIUM PHOSPHATE 10 MG/ML IJ SOLN
10.0000 mg | Freq: Once | INTRAMUSCULAR | Status: AC
  Administered 2020-11-09: 10 mg via INTRAMUSCULAR
  Filled 2020-11-09: qty 1

## 2020-11-09 NOTE — ED Provider Notes (Signed)
[facility]-EMERGENCY DEPT Provider Note   CSN: [id number] Arrival date & time: 11/09/20  1854     History Chief Complaint  Patient presents with  . Hand Pain    Vincent Mcpherson is a 40 y.o. male.  Who presents emergency department chief complaint of right wrist pain.  Patient has a history of alcohol abuse.  He was recently admitted for behavioral health issues.  Patient states that last night he ate some sausage.  When he awoke this morning he had severe pain in his right wrist and swelling.  Is never had anything like this before.  He denies any injuries.  He has no previous history of gout.  He denies fever or chills.  He states the pain is severe.  HPI     Past Medical History:  Diagnosis Date  . Atrial fibrillation (HCC)   . Hypertension   . Palpitations   . Syncope 05/04/12   while visiting another pt at [facility]  . Varicose vein     Patient Active Problem List   Diagnosis Date Noted  . Psychotic disorder (HCC) 10/26/2020  . Atrial fibrillation with RVR (HCC) 09/01/2017  . Atrial fibrillation (HCC) 09/03/2012  . Syncope 09/03/2012  . OSA (obstructive sleep apnea) 09/03/2012    Past Surgical History:  Procedure Laterality Date  . APPENDECTOMY         Family History  Problem Relation Age of Onset  . Atrial fibrillation Mother     Social History   Tobacco Use  . Smoking status: Current Every Day Smoker    Packs/day: 1.00    Types: Cigarettes  . Smokeless tobacco: Never Used  Vaping Use  . Vaping Use: Never used  Substance Use Topics  . Alcohol use: Yes  . Drug use: Yes    Types: Marijuana    Home Medications Prior to Admission medications   Medication Sig Start Date End Date Taking? Authorizing Provider  naproxen sodium (ALEVE) 220 MG tablet Take 660 mg by mouth daily as needed (headache).    [provider]  OLANZapine (ZYPREXA) 10 MG tablet Take 1 tablet (10 mg total) by mouth at bedtime. 10/29/20   Antonieta Pert,  MD    Allergies    Penicillins  Review of Systems   Review of Systems Ten systems reviewed and are negative for acute change, except as noted in the HPI.   Physical Exam Updated Vital Signs BP (!) 147/99   Pulse 82   Temp 98.5 F (36.9 C) (Oral)   Resp 18   SpO2 99%   Physical Exam Physical Exam  Nursing note and vitals reviewed. Constitutional: He appears well-developed and well-nourished. No distress.  HENT:  Head: Normocephalic and atraumatic.  Eyes: Conjunctivae normal are normal. No scleral icterus.  Neck: Normal range of motion. Neck supple.  Cardiovascular: Normal rate, regular rhythm and normal heart sounds.   Pulmonary/Chest: Effort normal and breath sounds normal. No respiratory distress.  Abdominal: Soft. There is no tenderness.  Musculoskeletal: Right wrist with significant swelling, erythema and exquisite tenderness.  2+ radial pulse refill less than 2 seconds.  Able to move all fingers.  Severe pain with any movement of the wrist. Neurological: He is alert.  Skin: Skin is warm and dry. He is not diaphoretic.  Psychiatric: His behavior is normal.    ED Results / Procedures / Treatments   Labs (all labs ordered are listed, but only abnormal results are displayed) Labs Reviewed - No data to  display  EKG None  Radiology No results found.  Procedures Procedures  Medications Ordered in ED Medications  dexamethasone (DECADRON) injection 10 mg (10 mg Intramuscular Given 11/09/20 2218)  ketorolac (TORADOL) injection 30 mg (30 mg Intramuscular Given 11/09/20 2218)    ED Course  I have reviewed the triage vital signs and the nursing notes.  Pertinent labs & imaging results that were available during my care of the patient were reviewed by me and considered in my medical decision making (see chart for details).    MDM Rules/Calculators/A&P                          40 year old male here with acute wrist pain.  He does have a history of alcohol misuse  and psychiatric disorder..  Afebrile.  Given his history and acute onset of pain my highest suspicion is for gout.  I ordered and reviewed images of a right wrist x-ray which shows soft tissue swelling without any other abnormality or bony destruction.  Patient does not have a history of such however he has no recent injuries to the wrist which could potentiate septic joint or osteomyelitis.  Will treat as gout at this time and if the patient is worsening, having fevers, chills, or other systemic system symptoms or pain is not improving he is advised that he should return as soon as possible.  Although the patient has a psychiatric disorder is currently lucid without abnormal thought or be hard bizarre behavior and appears to understand these instructions well.  She will be discharged with pain medication.  I reviewed the PDMP prior to giving him some pain medication.  Patient given Decadron and Toradol prior to discharge with significant improvement in pain. Final Clinical Impression(s) / ED Diagnoses Final diagnoses:  None    Rx / DC Orders ED Discharge Orders    None       Arthor Captain, PA-C 11/10/20 1008    Benjiman Core, MD 11/16/20 [phone]

## 2020-11-09 NOTE — Discharge Instructions (Addendum)
Contact a health care provider if you have: °Another gout attack. °Continuing symptoms of a gout attack after 10 days of treatment. °Side effects from your medicines. °Chills or a fever. °Burning pain when you urinate. °Pain in your lower back or belly. °Get help right away if you: °Have severe or uncontrolled pain. °Cannot urinate. °

## 2020-11-09 NOTE — ED Notes (Signed)
Pt removed arm sling and said he would use it later.

## 2020-11-09 NOTE — ED Triage Notes (Addendum)
Pt c/o nontraumatic right hand pain. Pt reports waking up and left hand hurting. Appears to be a quarter sized abscess/ swollen area above wrist.

## 2020-12-29 ENCOUNTER — Emergency Department (HOSPITAL_COMMUNITY): Payer: No Typology Code available for payment source

## 2020-12-29 ENCOUNTER — Emergency Department (HOSPITAL_COMMUNITY)
Admission: EM | Admit: 2020-12-29 | Discharge: 2020-12-29 | Disposition: A | Discharge status: Home / Self Care | Payer: No Typology Code available for payment source | Attending: Emergency Medicine | Admitting: Emergency Medicine

## 2020-12-29 ENCOUNTER — Encounter (HOSPITAL_COMMUNITY): Payer: Self-pay

## 2020-12-29 DIAGNOSIS — I10 Essential (primary) hypertension: Secondary | ICD-10-CM | POA: Insufficient documentation

## 2020-12-29 DIAGNOSIS — M545 Low back pain, unspecified: Secondary | ICD-10-CM | POA: Insufficient documentation

## 2020-12-29 DIAGNOSIS — F17210 Nicotine dependence, cigarettes, uncomplicated: Secondary | ICD-10-CM | POA: Diagnosis not present

## 2020-12-29 MED ORDER — KETOROLAC TROMETHAMINE 30 MG/ML IJ SOLN
30.0000 mg | Freq: Once | INTRAMUSCULAR | Status: AC
  Administered 2020-12-29: 30 mg via INTRAMUSCULAR
  Filled 2020-12-29: qty 1

## 2020-12-29 MED ORDER — CYCLOBENZAPRINE HCL 10 MG PO TABS: 10.0000 mg | ORAL_TABLET | Freq: Three times a day (TID) | ORAL | 0 refills | Status: AC

## 2020-12-29 MED ORDER — NAPROXEN 375 MG PO TABS: 375.0000 mg | ORAL_TABLET | Freq: Two times a day (BID) | ORAL | 0 refills | Status: AC
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
## 2020-12-29 NOTE — ED Notes (Signed)
An After Visit Summary was printed and given to the patient. Discharge instructions given and no further questions at this time.  

## 2020-12-29 NOTE — Discharge Instructions (Signed)
I have prescribed muscle relaxers for your pain, please do not drink or drive while taking this medications as they can make you drowsy.     If you experience any bowel or bladder incontinence, fever, worsening in your symptoms please return to the ED.
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
## 2021-03-30 ENCOUNTER — Other Ambulatory Visit: Payer: Self-pay

## 2021-03-30 ENCOUNTER — Ambulatory Visit (HOSPITAL_COMMUNITY)
Admission: EM | Admit: 2021-03-30 | Discharge: 2021-03-30 | Disposition: A | Discharge status: Home / Self Care | Payer: Self-pay | Attending: Emergency Medicine | Admitting: Emergency Medicine

## 2021-03-30 ENCOUNTER — Encounter (HOSPITAL_COMMUNITY): Payer: Self-pay | Admitting: Emergency Medicine

## 2021-03-30 DIAGNOSIS — M791 Myalgia, unspecified site: Secondary | ICD-10-CM

## 2021-03-30 DIAGNOSIS — M25512 Pain in left shoulder: Secondary | ICD-10-CM

## 2021-03-30 MED ORDER — METHOCARBAMOL 500 MG PO TABS: 500.0000 mg | ORAL_TABLET | Freq: Two times a day (BID) | ORAL | 0 refills | Status: DC

## 2021-03-30 MED ORDER — PREDNISONE 10 MG (21) PO TBPK: ORAL_TABLET | Freq: Every day | ORAL | 0 refills | Status: DC

## 2021-03-30 NOTE — ED Provider Notes (Signed)
[facility]    CSN: [id number] Arrival date & time: 03/30/21  1137      History   Chief Complaint Chief Complaint  Patient presents with   Shoulder Pain    HPI Vincent Mcpherson is a 40 y.o. male.   Pt awaken today with pain to lt side of neck that radiates to lt arm shoulder area. Denies any chest pain, no sob, has not taken anything pta. Never had this before.    Past Medical History:  Diagnosis Date   Atrial fibrillation (HCC)    Hypertension    Palpitations    Syncope 05/04/12   while visiting another pt at [facility]   Varicose vein     Patient Active Problem List   Diagnosis Date Noted   Psychotic disorder (HCC) 10/26/2020   Atrial fibrillation with RVR (HCC) 09/01/2017   Atrial fibrillation (HCC) 09/03/2012   Syncope 09/03/2012   OSA (obstructive sleep apnea) 09/03/2012    Past Surgical History:  Procedure Laterality Date   APPENDECTOMY         Home Medications    Prior to Admission medications   Medication Sig Start Date End Date Taking? Authorizing Provider  methocarbamol (ROBAXIN) 500 MG tablet Take 1 tablet (500 mg total) by mouth 2 (two) times daily. 03/30/21  Yes Coralyn Mark, NP  predniSONE (STERAPRED UNI-PAK 21 TAB) 10 MG (21) TBPK tablet Take by mouth daily. Take 6 tabs by mouth daily  for 2 days, then 5 tabs for 2 days, then 4 tabs for 2 days, then 3 tabs for 2 days, 2 tabs for 2 days, then 1 tab by mouth daily for 2 days 03/30/21  Yes Maple Mirza L, NP  naproxen sodium (ALEVE) 220 MG tablet Take 660 mg by mouth daily as needed (headache).    [provider]  OLANZapine (ZYPREXA) 10 MG tablet Take 1 tablet (10 mg total) by mouth at bedtime. 10/29/20   Antonieta Pert, MD  oxyCODONE-acetaminophen (PERCOCET) 5-325 MG tablet Take 1 tablet by mouth every 4 (four) hours as needed for severe pain. 11/09/20   Arthor Captain, PA-C    Family History Family History  Problem Relation Age of Onset   Atrial fibrillation Mother      Social History Social History   Tobacco Use   Smoking status: Every Day    Packs/day: 1.00    Pack years: 0.00    Types: Cigarettes   Smokeless tobacco: Never  Vaping Use   Vaping Use: Never used  Substance Use Topics   Alcohol use: Yes   Drug use: Yes    Types: Marijuana     Allergies   Penicillins   Review of Systems Review of Systems  Respiratory: Negative.    Cardiovascular: Negative.   Gastrointestinal: Negative.   Genitourinary: Negative.   Musculoskeletal:  Positive for arthralgias.       Lt side of neck and shoulder pain with movement   Skin: Negative.   Neurological: Negative.     Physical Exam Triage Vital Signs ED Triage Vitals  Enc Vitals Group     BP 03/30/21 1253 132/82     Pulse Rate 03/30/21 1253 70     Resp 03/30/21 1253 18     Temp 03/30/21 1253 98.6 F (37 C)     Temp Source 03/30/21 1253 Oral     SpO2 03/30/21 1253 99 %     Weight --      Height --  Head Circumference --      Peak Flow --      Pain Score 03/30/21 1252 8     Pain Loc --      Pain Edu? --      Excl. in GC? --    No data found.  Updated Vital Signs BP 132/82   Pulse 70   Temp 98.6 F (37 C) (Oral)   Resp 18   SpO2 99%   Visual Acuity Right Eye Distance:   Left Eye Distance:   Bilateral Distance:    Right Eye Near:   Left Eye Near:    Bilateral Near:     Physical Exam Constitutional:      Appearance: Normal appearance.  Neck:     Comments: Decrease rom due to pain, full rom to arm and shoulder area. Pain more with rotation of neck  Cardiovascular:     Rate and Rhythm: Normal rate.  Pulmonary:     Effort: Pulmonary effort is normal.  Abdominal:     General: Abdomen is flat.  Musculoskeletal:     Cervical back: Tenderness present.  Skin:    General: Skin is warm.  Neurological:     General: No focal deficit present.     Mental Status: He is alert.     UC Treatments / Results  Labs (all labs ordered are listed, but only abnormal  results are displayed) Labs Reviewed - No data to display  EKG   Radiology No results found.  Procedures Procedures (including critical care time)  Medications Ordered in UC Medications - No data to display  Initial Impression / Assessment and Plan / UC Course  I have reviewed the triage vital signs and the nursing notes.  Pertinent labs & imaging results that were available during my care of the patient were reviewed by me and considered in my medical decision making (see chart for details).     Rest  Heat  Take meds as needed  Sounds more muscle related can take a few weeks to get better  Take nsaids as needed  Final Clinical Impressions(s) / UC Diagnoses   Final diagnoses:  Acute pain of left shoulder  Muscle pain     Discharge Instructions      Rest  Heat will also help after 48 hours begin to move area to prevent it from becoming stiff. Avoid driving with medications can cause drowsy Take motrin as needed for pain       ED Prescriptions     Medication Sig Dispense Auth. Provider   methocarbamol (ROBAXIN) 500 MG tablet Take 1 tablet (500 mg total) by mouth 2 (two) times daily. 20 tablet Maple Mirza L, NP   predniSONE (STERAPRED UNI-PAK 21 TAB) 10 MG (21) TBPK tablet Take by mouth daily. Take 6 tabs by mouth daily  for 2 days, then 5 tabs for 2 days, then 4 tabs for 2 days, then 3 tabs for 2 days, 2 tabs for 2 days, then 1 tab by mouth daily for 2 days 42 tablet Coralyn Mark, NP      PDMP not reviewed this encounter.   Coralyn Mark, NP 03/30/21 1555

## 2021-03-30 NOTE — ED Triage Notes (Signed)
Pt is present today with left shoulder pain that started Sunday night. Pt states that he can not lift his arm without feeling a sharp pain. Pt denies any injury

## 2021-03-30 NOTE — Discharge Instructions (Addendum)
Rest  Heat will also help after 48 hours begin to move area to prevent it from becoming stiff. Avoid driving with medications can cause drowsy Take motrin as needed for pain

## 2022-12-27 IMAGING — CR DG LUMBAR SPINE COMPLETE 4+V
5 series · 5 of 5 positions shown · non-contrast
Comparison: 12/24/2016

CLINICAL DATA: Low back pain

EXAM:
LUMBAR SPINE - COMPLETE 4+ VIEW

[t lumbar spine ap]
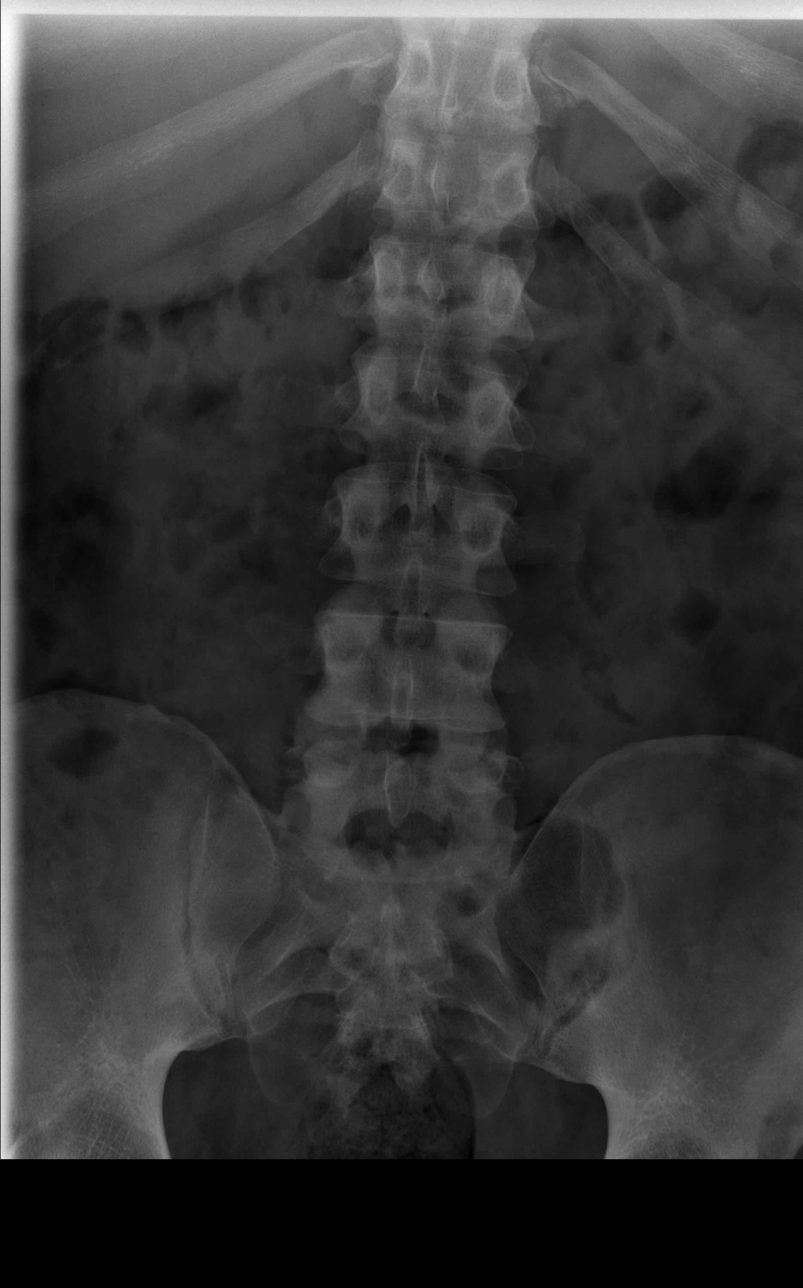

[t lumbar spine obl (1 of 2)]
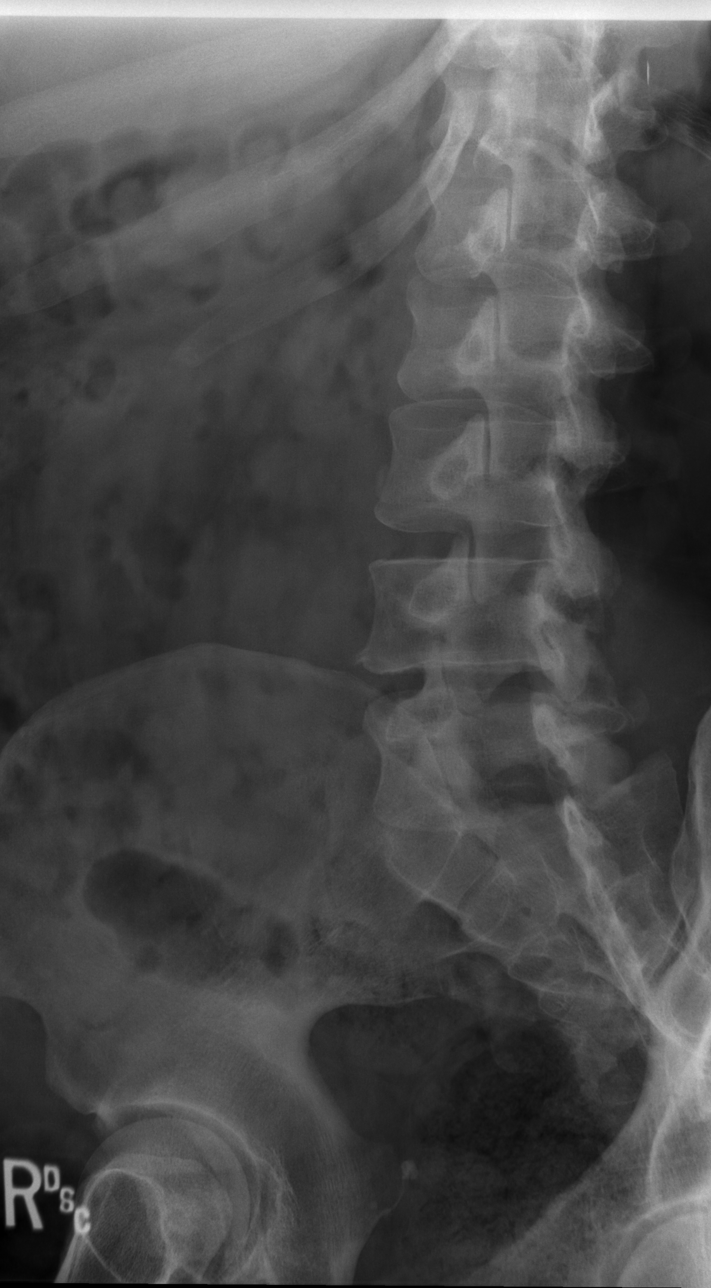

[t lumbar spine obl (2 of 2)]
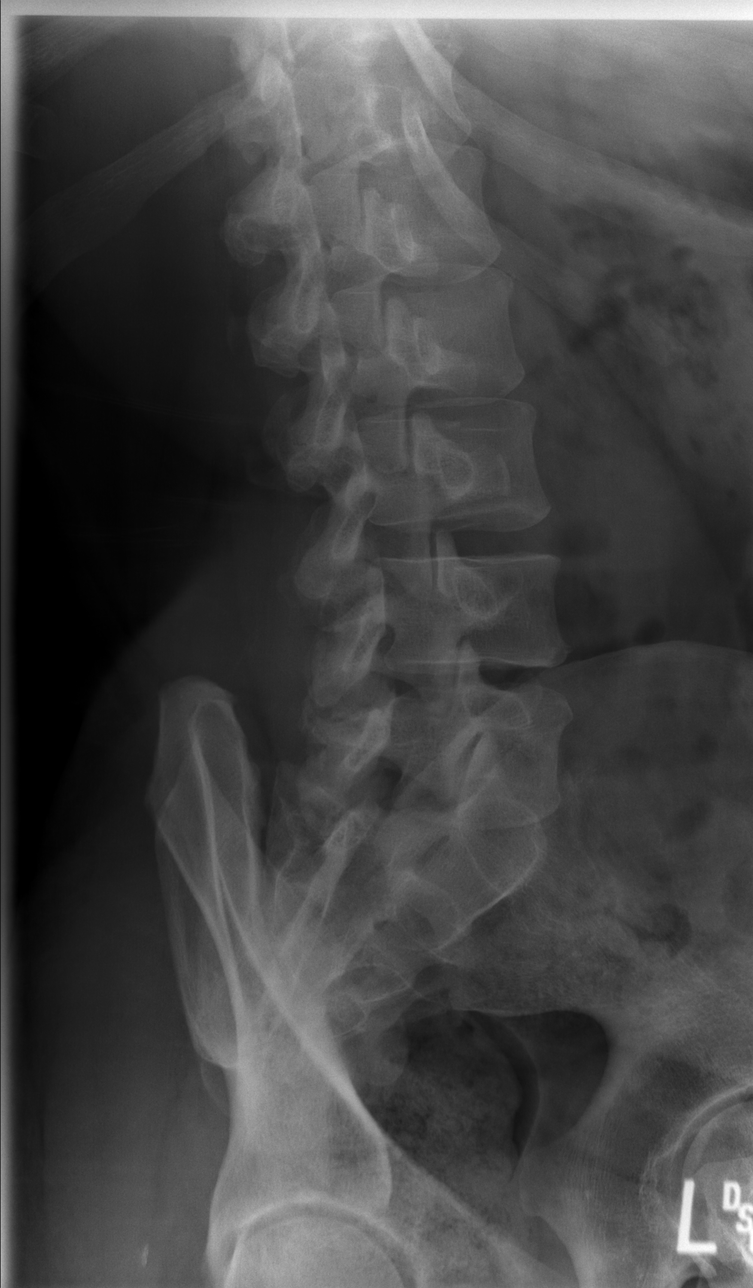

[t lumbar spine lat]
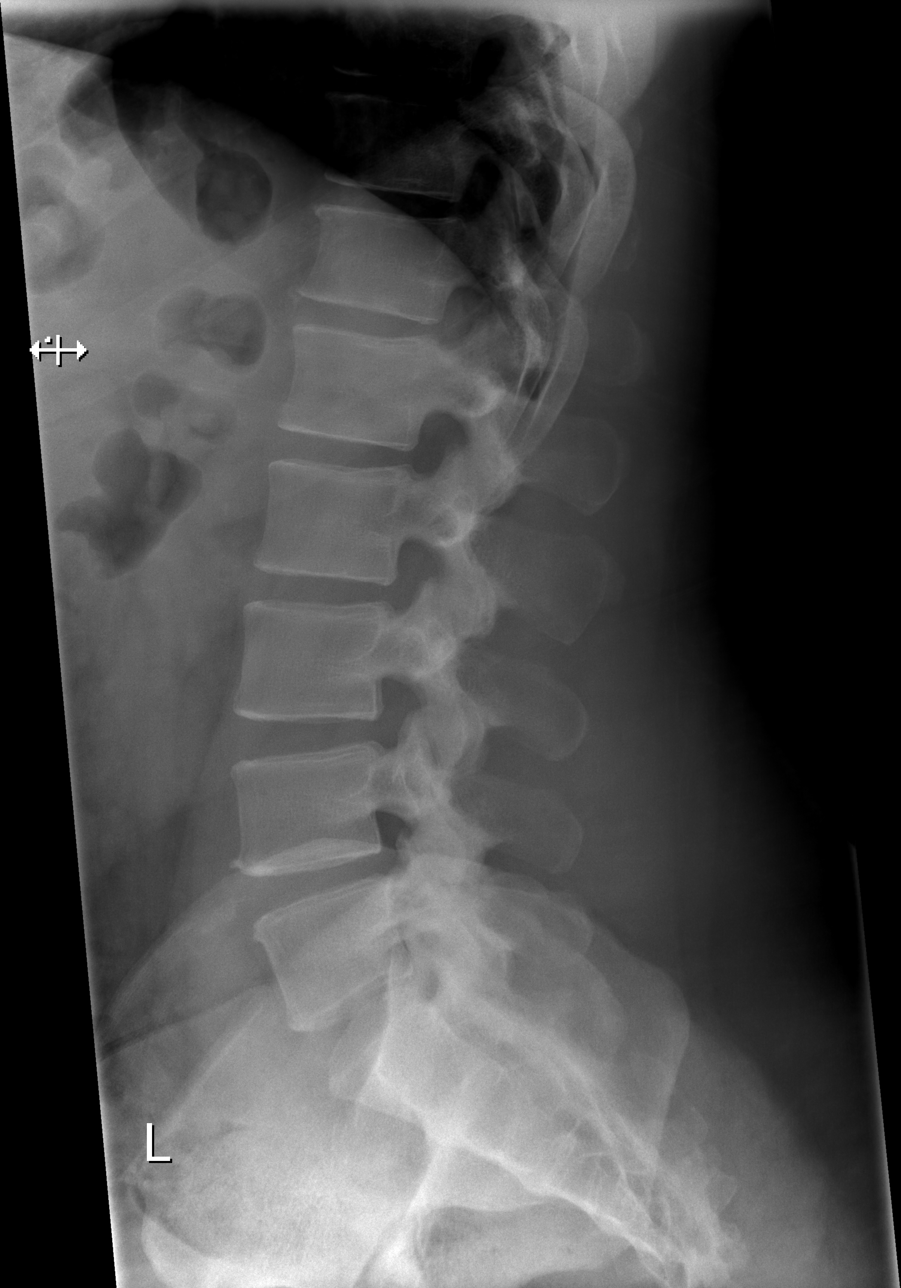

[t lumbar l-5 s-1 spot]
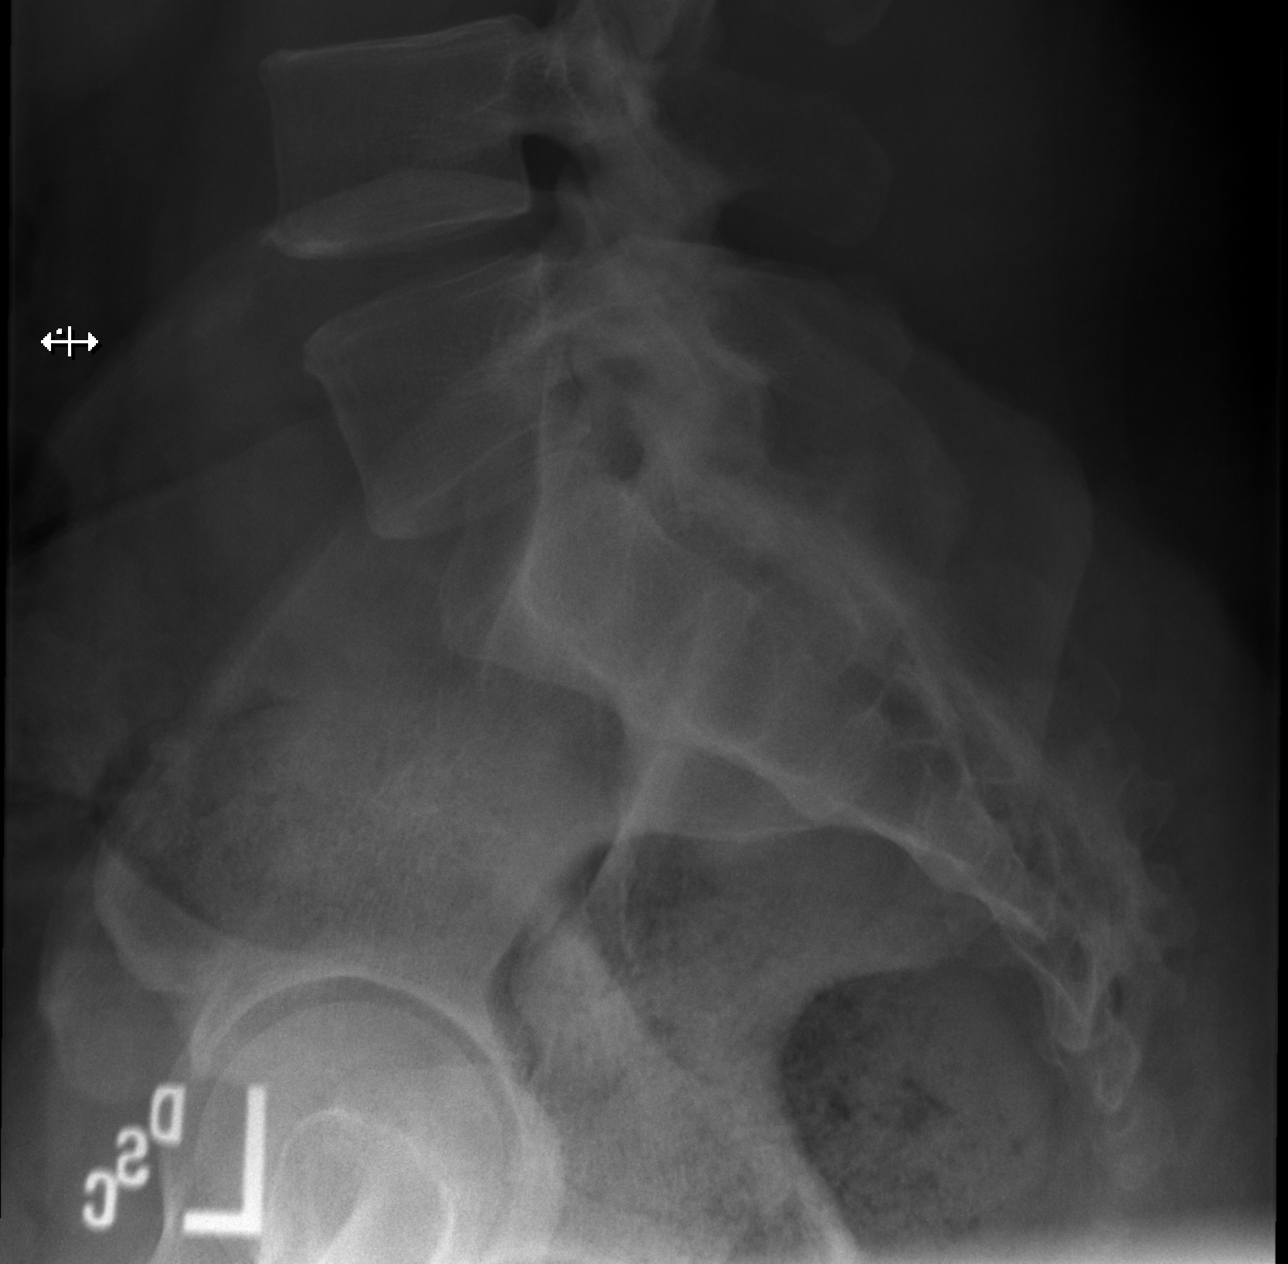

[5 of 5 positions shown; findings below may reference images not displayed]

FINDINGS: There is no evidence of lumbar spine fracture. Alignment is normal.
Intervertebral disc spaces are maintained.
IMPRESSION: Negative.

## 2023-09-14 ENCOUNTER — Ambulatory Visit (HOSPITAL_COMMUNITY)
Admission: EM | Admit: 2023-09-14 | Discharge: 2023-09-14 | Disposition: A | Discharge status: Home / Self Care | Payer: Self-pay | Attending: Family Medicine | Admitting: Family Medicine

## 2023-09-14 ENCOUNTER — Encounter (HOSPITAL_COMMUNITY): Payer: Self-pay | Admitting: Emergency Medicine

## 2023-09-14 DIAGNOSIS — R0981 Nasal congestion: Secondary | ICD-10-CM

## 2023-09-14 NOTE — ED Provider Notes (Signed)
[facility]    CSN: [id number] Arrival date & time: 09/14/23  1456      History   Chief Complaint Chief Complaint  Patient presents with   doctors note    HPI Vincent Mcpherson is a 42 y.o. male.   HPI Here for nasal congestion/sinus dc that he had been having a few days ago, making him have to miss work. He feels much better now, and needs a note to return to work.  No f/c.  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 Authorizing Provider  methocarbamol (ROBAXIN) 500 MG tablet Take 1 tablet (500 mg total) by mouth 2 (two) times daily. 03/30/21   Coralyn Mark, NP  naproxen sodium (ALEVE) 220 MG tablet Take 660 mg by mouth daily as needed (headache).    [provider]  OLANZapine (ZYPREXA) 10 MG tablet Take 1 tablet (10 mg total) by mouth at bedtime. 10/29/20   Antonieta Pert, MD    Family History Family History  Problem Relation Age of Onset   Atrial fibrillation Mother     Social History Social History   Tobacco Use   Smoking status: Every Day    Current packs/day: 1.00    Types: Cigarettes   Smokeless tobacco: Never  Vaping Use   Vaping status: Never Used  Substance Use Topics   Alcohol use: Yes   Drug use: Yes    Types: Marijuana     Allergies   Penicillins   Review of Systems Review of Systems   Physical Exam Triage Vital Signs ED Triage Vitals [09/14/23 1512]   Encounter Vitals Group     BP (!) 135/91     Systolic BP Percentile      Diastolic BP Percentile      Pulse Rate 87     Resp 17     Temp 97.7 F (36.5 C)     Temp Source Oral     SpO2 95 %     Weight      Height      Head Circumference      Peak Flow      Pain Score 0     Pain Loc      Pain Education      Exclude from Growth Chart    No data found.  Updated Vital Signs BP (!) 135/91 (BP Location: Right Arm)   Pulse 87   Temp 97.7 F (36.5 C) (Oral)   Resp 17   SpO2 95%   Visual Acuity Right Eye Distance:   Left Eye Distance:   Bilateral Distance:    Right Eye Near:   Left Eye Near:    Bilateral Near:     Physical Exam Vitals reviewed.  Constitutional:      General: He is not in acute distress.    Appearance:  He is not toxic-appearing.  Cardiovascular:     Rate and Rhythm: Normal rate and regular rhythm.  Pulmonary:     Effort: Pulmonary effort is normal.     Breath sounds: Normal breath sounds.  Skin:    Coloration: Skin is not pale.  Neurological:     Mental Status: He is alert and oriented to person, place, and time.  Psychiatric:        Behavior: Behavior normal.      UC Treatments / Results  Labs (all labs ordered are listed, but only abnormal results are displayed) Labs Reviewed - No data to display  EKG   Radiology No results found.  Procedures Procedures (including critical care time)  Medications Ordered in UC Medications - No data to display  Initial Impression / Assessment and Plan / UC Course  I have reviewed the triage vital signs and the nursing notes.  Pertinent labs & imaging results that were available during my care of the patient were reviewed by me and considered in my medical decision making (see chart for details).     Work note provided.  No other medicine recommendations made  Final Clinical Impressions(s) / UC Diagnoses   Final diagnoses:  Nasal congestion     Discharge Instructions      Glad you  are feeling better     ED Prescriptions   None    I have reviewed the PDMP during this encounter.   Zenia Resides, MD 09/14/23 [phone]

## 2023-09-14 NOTE — ED Triage Notes (Signed)
Pt presents for work note saying it is ok for him to go back to work. He was out for a few days with sinus symptoms

## 2023-09-14 NOTE — Discharge Instructions (Signed)
Glad you are feeling  better

## 2024-01-30 ENCOUNTER — Encounter (HOSPITAL_COMMUNITY): Payer: Self-pay

## 2024-01-30 ENCOUNTER — Ambulatory Visit (HOSPITAL_COMMUNITY)
Admission: EM | Admit: 2024-01-30 | Discharge: 2024-01-30 | Disposition: A | Discharge status: Home / Self Care | Payer: Self-pay | Attending: Family Medicine | Admitting: Family Medicine

## 2024-01-30 DIAGNOSIS — R5383 Other fatigue: Secondary | ICD-10-CM

## 2024-01-30 NOTE — ED Triage Notes (Signed)
 Patient reports that he left work early 3 days ago and reports that he had body aches, cough, and dizziness.  Patient states that he called out on Mother's Day and it was considered a holiday and it is grounds for termination. Patient states he just needs a Doctor's note.

## 2024-01-30 NOTE — ED Provider Notes (Signed)
 [facility]   [id number] 01/30/24 Arrival Time: 1444  ASSESSMENT & PLAN:  1. Other fatigue    Feeling better. No workup needed at this time. Work note provided.   Follow-up Information     [facility].   Specialty: Urgent Care Why: As needed. Contact information: [address] [place]  [address] [phone]                Reviewed expectations re: course of current medical issues. Questions answered. Outlined signs and symptoms indicating need for more acute intervention. Understanding verbalized. After Visit Summary given.   SUBJECTIVE: History from: Patient. Vincent Mcpherson is a 43 y.o. male. Patient reports that he left work early 3 days ago and reports that he had body aches, cough, and dizziness.  Patient states that he called out on Mother's Day and it was considered a holiday and it is grounds for termination. Patient states he just needs a Doctor's note. Feels like his normal self now.  OBJECTIVE:  Vitals:   01/30/24 1524  BP: 115/83  Pulse: 91  Resp: 16  Temp: 98.7 F (37.1 C)  TempSrc: Oral  SpO2: 94%    General appearance: alert; no distress Psychological: alert and cooperative; normal mood and affect   Allergies  Allergen Reactions   Penicillins Rash    Has patient had a PCN reaction causing immediate rash, facial/tongue/throat swelling, SOB or lightheadedness with hypotension: Yes Has patient had a PCN reaction causing severe rash involving mucus membranes or skin necrosis: Yes Has patient had a PCN reaction that required hospitalization: Yes Has patient had a PCN reaction occurring within the last 10 years: No If all of the above answers are "NO", then may proceed with Cephalosporin use.    Past Medical History:  Diagnosis Date   Atrial fibrillation (HCC)    Hypertension    Palpitations    Syncope 05/04/12   while visiting another pt at [facility]   Varicose vein    Social  History   Socioeconomic History   Marital status: Single    Spouse name: Not on file   Number of children: Not on file   Years of education: Not on file   Highest education level: Not on file  Occupational History   Not on file  Tobacco Use   Smoking status: Every Day    Types: Cigars   Smokeless tobacco: Never  Vaping Use   Vaping status: Never Used  Substance and Sexual Activity   Alcohol use: Yes   Drug use: Yes    Types: Marijuana   Sexual activity: Not on file  Other Topics Concern   Not on file  Social History Narrative   Not on file   Social Drivers of Health   Financial Resource Strain: Not on file  Food Insecurity: Not on file  Transportation Needs: Not on file  Physical Activity: Not on file  Stress: Not on file  Social Connections: Unknown (01/28/2022)   Received from [facility]   Social Network    Social Network: Not on file  Intimate Partner Violence: Unknown (12/22/2021)   Received from [facility]   HITS    Physically Hurt: Not on file    Insult or Talk Down To: Not on file    Threaten Physical Harm: Not on file    Scream or Curse: Not on file   Family History  Problem Relation Age of Onset   Atrial fibrillation Mother  Past Surgical History:  Procedure Laterality Date   APPENDECTOMY       Afton Albright, MD 01/30/24 [phone]

## 2024-02-11 ENCOUNTER — Ambulatory Visit (HOSPITAL_COMMUNITY)
Admission: EM | Admit: 2024-02-11 | Discharge: 2024-02-11 | Disposition: A | Discharge status: Home / Self Care | Payer: Self-pay | Attending: Nurse Practitioner | Admitting: Nurse Practitioner

## 2024-02-11 ENCOUNTER — Encounter (HOSPITAL_COMMUNITY): Payer: Self-pay | Admitting: Emergency Medicine

## 2024-02-11 DIAGNOSIS — M25532 Pain in left wrist: Secondary | ICD-10-CM | POA: Insufficient documentation

## 2024-02-11 LAB — URIC ACID: Uric Acid, Serum: 9.5 mg/dL — ABNORMAL HIGH (ref 3.7–8.6)

## 2024-02-11 MED ORDER — DEXAMETHASONE SODIUM PHOSPHATE 10 MG/ML IJ SOLN
10.0000 mg | Freq: Once | INTRAMUSCULAR | Status: AC
  Administered 2024-02-11: 10 mg via INTRAMUSCULAR

## 2024-02-11 MED ORDER — COLCHICINE 0.6 MG PO TABS: 0.6000 mg | ORAL_TABLET | Freq: Every day | ORAL | 0 refills | Status: AC

## 2024-02-11 MED ORDER — DEXAMETHASONE SODIUM PHOSPHATE 10 MG/ML IJ SOLN
INTRAMUSCULAR | Status: AC
  Filled 2024-02-11: qty 1

## 2024-02-11 MED ORDER — KETOROLAC TROMETHAMINE 30 MG/ML IJ SOLN
INTRAMUSCULAR | Status: AC
  Filled 2024-02-11: qty 1

## 2024-02-11 MED ORDER — KETOROLAC TROMETHAMINE 30 MG/ML IJ SOLN
30.0000 mg | Freq: Once | INTRAMUSCULAR | Status: AC
  Administered 2024-02-11: 30 mg via INTRAMUSCULAR

## 2024-02-11 NOTE — Discharge Instructions (Signed)
 We are checking the gout level today and we will contact you with abnormal results later this week.  We have given you 2 injections of medicine that will help with pain and inflammation today.  Continue the colchicine daily for the next 3 days to help with inflammation.  You can use the Ace wrap to help provide support and compression and continue ice as needed.  Seek care if symptoms worsen despite treatment.

## 2024-02-11 NOTE — ED Triage Notes (Signed)
 Pt reports left wrist pain and swelling that got worse since Friday night. Put ice on wrist.

## 2024-02-11 NOTE — ED Provider Notes (Signed)
 [facility]    CSN: [id number] Arrival date & time: 02/11/24  1019      History   Chief Complaint Chief Complaint  Patient presents with   Wrist Pain    HPI Vincent Mcpherson is a 43 y.o. male.   Patient presents today for 1 to 2-day history of left wrist pain.  Denies recent fall, trauma, or injury to the wrist.  Reports he does not eat pork or red meat, does not eat organ meat.  Drinks a lot of beer at baseline per his report.  Reports the wrist is swollen, painful, and has decreased range of motion in the wrist.  No numbness or tingling in the fingertips, warmth, redness, fever, nausea/vomiting.  No open wounds or sores.  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  He thinks he may have had gout in his right wrist in the past.  Took 2 tablets of his brothers colchicine 0.6 mg last night around 1 AM which did seem to help with the pain.  He has also been applying ice to the area which helps with pain and swelling.    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 Authorizing Provider  colchicine 0.6 MG tablet Take 1 tablet (0.6 mg total) by mouth daily for 3 days. 02/11/24 02/14/24 Yes Wilhemena Harbour, NP  naproxen  sodium (ALEVE ) 220 MG tablet Take 660 mg by mouth daily as needed (headache).    [provider]    Family History Family History  Problem Relation Age of  Onset   Atrial fibrillation Mother     Social History Social History   Tobacco Use   Smoking status: Every Day    Types: Cigars   Smokeless tobacco: Never  Vaping Use   Vaping status: Never Used  Substance Use Topics   Alcohol use: Yes   Drug use: Yes    Types: Marijuana     Allergies   Penicillins   Review of Systems Review of Systems Per HPI  Physical Exam Triage Vital Signs ED Triage Vitals  Encounter Vitals Group     BP 02/11/24 1052 (!) 143/87     Systolic BP Percentile --      Diastolic BP Percentile --      Pulse Rate 02/11/24 1052 92     Resp 02/11/24 1052 17     Temp 02/11/24 1052 97.9 F (36.6 C)     Temp Source 02/11/24 1052 Oral     SpO2 02/11/24 1052 95 %     Weight --      Height --      Head Circumference --      Peak Flow --      Pain Score 02/11/24 1051 9     Pain Loc --  Pain Education --      Exclude from Growth Chart --    No data found.  Updated Vital Signs BP (!) 143/87 (BP Location: Right Arm)   Pulse 92   Temp 97.9 F (36.6 C) (Oral)   Resp 17   SpO2 95%   Visual Acuity Right Eye Distance:   Left Eye Distance:   Bilateral Distance:    Right Eye Near:   Left Eye Near:    Bilateral Near:     Physical Exam Vitals and nursing note reviewed.  Constitutional:      General: He is not in acute distress.    Appearance: Normal appearance. He is not toxic-appearing.  Pulmonary:     Effort: Pulmonary effort is normal. No respiratory distress.  Musculoskeletal:     Comments: Inspection: diffuse swelling to left wrist; no bruising, obvious deformity or redness Palpation: tender to palpation left wrist diffusely; no sensitivity with light touch; no obvious deformities palpated ROM: Difficult to assess secondary to pain Strength: difficult to assess of LUE due to pain Neurovascular: neurovascularly intact in distal left upper extremity   Skin:    General: Skin is warm and dry.     Capillary Refill: Capillary refill takes  less than 2 seconds.     Coloration: Skin is not jaundiced or pale.     Findings: No erythema.  Neurological:     Mental Status: He is alert and oriented to person, place, and time.  Psychiatric:        Behavior: Behavior is cooperative.      UC Treatments / Results  Labs (all labs ordered are listed, but only abnormal results are displayed) Labs Reviewed  URIC ACID    EKG   Radiology No results found.  Procedures Procedures (including critical care time)  Medications Ordered in UC Medications  ketorolac  (TORADOL ) 30 MG/ML injection 30 mg (30 mg Intramuscular Given 02/11/24 1135)  dexamethasone  (DECADRON ) injection 10 mg (10 mg Intramuscular Given 02/11/24 1132)    Initial Impression / Assessment and Plan / UC Course  I have reviewed the triage vital signs and the nursing notes.  Pertinent labs & imaging results that were available during my care of the patient were reviewed by me and considered in my medical decision making (see chart for details).   Patient is well-appearing, normotensive, afebrile, not tachycardic, not tachypneic, oxygenating well on room air.   1. Left wrist pain Unclear cause No injury, x-ray deferred No concern for septic arthritis  Uric acid pending, suspect possible musculoskeletal cause such as wrist tendinitis, we are treating with Decadron  10 mg changed Toradol  30 mg IM today, start colchicine for the next few days as this did give some benefit overnight last night Recommended strict ER follow up if symptoms worsen despite treatment Ace wrap applied for support and compression Work excuse provided  The patient was given the opportunity to ask questions.  All questions answered to their satisfaction.  The patient is in agreement to this plan.   Final Clinical Impressions(s) / UC Diagnoses   Final diagnoses:  Left wrist pain     Discharge Instructions      We are checking the gout level today and we will contact you with abnormal  results later this week.  We have given you 2 injections of medicine that will help with pain and inflammation today.  Continue the colchicine daily for the next 3 days to help with inflammation.  You can use the Ace wrap to  help provide support and compression and continue ice as needed.  Seek care if symptoms worsen despite treatment.  ED Prescriptions     Medication Sig Dispense Auth. Provider   colchicine 0.6 MG tablet Take 1 tablet (0.6 mg total) by mouth daily for 3 days. 3 tablet Wilhemena Harbour, NP      PDMP not reviewed this encounter.   Wilhemena Harbour, NP 02/11/24 1147

## 2024-02-12 ENCOUNTER — Ambulatory Visit (HOSPITAL_COMMUNITY): Payer: Self-pay
